# Patient Record
Sex: Female | Born: 1980 | Race: Black or African American | Hispanic: No | Smoking: Never smoker
Health system: Southern US, Community
[De-identification: ages and names within clinical notes are randomized; demographics above are authoritative.]

## PROBLEM LIST (undated history)

## (undated) DIAGNOSIS — D649 Anemia, unspecified: Secondary | ICD-10-CM

## (undated) DIAGNOSIS — N883 Incompetence of cervix uteri: Secondary | ICD-10-CM

## (undated) DIAGNOSIS — E559 Vitamin D deficiency, unspecified: Secondary | ICD-10-CM

## (undated) DIAGNOSIS — I10 Essential (primary) hypertension: Secondary | ICD-10-CM

## (undated) HISTORY — DX: Vitamin D deficiency, unspecified: E55.9

## (undated) HISTORY — DX: Anemia, unspecified: D64.9

## (undated) HISTORY — PX: TUBAL LIGATION: SHX77

## (undated) HISTORY — PX: OTHER SURGICAL HISTORY: SHX169

## (undated) HISTORY — DX: Incompetence of cervix uteri: N88.3

## (undated) HISTORY — DX: Essential (primary) hypertension: I10

---

## 2003-07-03 ENCOUNTER — Emergency Department (HOSPITAL_COMMUNITY): Admission: EM | Admit: 2003-07-03 | Discharge: 2003-07-03 | Payer: Self-pay | Admitting: Emergency Medicine

## 2004-04-12 ENCOUNTER — Inpatient Hospital Stay (HOSPITAL_COMMUNITY): Admission: AD | Admit: 2004-04-12 | Discharge: 2004-04-12 | Payer: Self-pay | Admitting: Obstetrics & Gynecology

## 2004-07-08 ENCOUNTER — Inpatient Hospital Stay (HOSPITAL_COMMUNITY): Admission: AD | Admit: 2004-07-08 | Discharge: 2004-07-08 | Payer: Self-pay | Admitting: Obstetrics & Gynecology

## 2004-08-14 ENCOUNTER — Inpatient Hospital Stay (HOSPITAL_COMMUNITY): Admission: AD | Admit: 2004-08-14 | Discharge: 2004-08-17 | Payer: Self-pay | Admitting: Obstetrics & Gynecology

## 2004-08-14 ENCOUNTER — Ambulatory Visit: Payer: Self-pay | Admitting: Neonatology

## 2004-08-14 ENCOUNTER — Ambulatory Visit: Payer: Self-pay | Admitting: Obstetrics and Gynecology

## 2004-08-25 ENCOUNTER — Encounter: Admission: RE | Admit: 2004-08-25 | Discharge: 2004-09-24 | Payer: Self-pay | Admitting: Obstetrics & Gynecology

## 2004-10-02 ENCOUNTER — Ambulatory Visit: Payer: Self-pay | Admitting: Family Medicine

## 2004-12-11 ENCOUNTER — Ambulatory Visit: Payer: Self-pay | Admitting: Obstetrics and Gynecology

## 2005-01-08 ENCOUNTER — Ambulatory Visit: Payer: Self-pay | Admitting: Obstetrics & Gynecology

## 2005-01-28 ENCOUNTER — Encounter (INDEPENDENT_AMBULATORY_CARE_PROVIDER_SITE_OTHER): Payer: Self-pay | Admitting: Specialist

## 2005-01-28 ENCOUNTER — Inpatient Hospital Stay (HOSPITAL_COMMUNITY): Admission: AD | Admit: 2005-01-28 | Discharge: 2005-01-28 | Payer: Self-pay | Admitting: Obstetrics and Gynecology

## 2005-02-12 ENCOUNTER — Ambulatory Visit: Payer: Self-pay | Admitting: Obstetrics and Gynecology

## 2006-02-02 ENCOUNTER — Inpatient Hospital Stay (HOSPITAL_COMMUNITY): Admission: AD | Admit: 2006-02-02 | Discharge: 2006-02-02 | Payer: Self-pay | Admitting: Family Medicine

## 2006-02-26 ENCOUNTER — Ambulatory Visit: Payer: Self-pay | Admitting: Obstetrics & Gynecology

## 2006-02-27 ENCOUNTER — Ambulatory Visit (HOSPITAL_COMMUNITY): Admission: RE | Admit: 2006-02-27 | Discharge: 2006-02-27 | Payer: Self-pay | Admitting: Family Medicine

## 2006-03-13 ENCOUNTER — Ambulatory Visit: Payer: Self-pay | Admitting: Gynecology

## 2006-03-25 ENCOUNTER — Ambulatory Visit: Payer: Self-pay | Admitting: Obstetrics and Gynecology

## 2006-03-25 ENCOUNTER — Inpatient Hospital Stay (HOSPITAL_COMMUNITY): Admission: RE | Admit: 2006-03-25 | Discharge: 2006-03-25 | Payer: Self-pay | Admitting: Obstetrics and Gynecology

## 2006-03-25 ENCOUNTER — Encounter: Payer: Self-pay | Admitting: Vascular Surgery

## 2006-04-04 ENCOUNTER — Ambulatory Visit: Payer: Self-pay | Admitting: *Deleted

## 2006-04-04 ENCOUNTER — Inpatient Hospital Stay (HOSPITAL_COMMUNITY): Admission: AD | Admit: 2006-04-04 | Discharge: 2006-04-04 | Payer: Self-pay | Admitting: Obstetrics & Gynecology

## 2006-04-10 ENCOUNTER — Ambulatory Visit: Payer: Self-pay | Admitting: Gynecology

## 2006-04-14 ENCOUNTER — Ambulatory Visit: Payer: Self-pay | Admitting: Gynecology

## 2006-04-24 ENCOUNTER — Ambulatory Visit: Payer: Self-pay | Admitting: Family Medicine

## 2006-04-26 ENCOUNTER — Ambulatory Visit: Payer: Self-pay | Admitting: *Deleted

## 2006-04-26 ENCOUNTER — Inpatient Hospital Stay (HOSPITAL_COMMUNITY): Admission: AD | Admit: 2006-04-26 | Discharge: 2006-04-26 | Payer: Self-pay | Admitting: Obstetrics and Gynecology

## 2006-04-30 ENCOUNTER — Inpatient Hospital Stay (HOSPITAL_COMMUNITY): Admission: AD | Admit: 2006-04-30 | Discharge: 2006-05-01 | Payer: Self-pay | Admitting: Obstetrics and Gynecology

## 2006-04-30 ENCOUNTER — Encounter (INDEPENDENT_AMBULATORY_CARE_PROVIDER_SITE_OTHER): Payer: Self-pay | Admitting: Specialist

## 2006-04-30 ENCOUNTER — Ambulatory Visit: Payer: Self-pay | Admitting: Obstetrics and Gynecology

## 2007-08-17 ENCOUNTER — Inpatient Hospital Stay (HOSPITAL_COMMUNITY): Admission: AD | Admit: 2007-08-17 | Discharge: 2007-08-17 | Payer: Self-pay | Admitting: Obstetrics and Gynecology

## 2007-08-26 ENCOUNTER — Encounter: Payer: Self-pay | Admitting: Obstetrics & Gynecology

## 2007-08-26 ENCOUNTER — Ambulatory Visit: Payer: Self-pay | Admitting: Obstetrics & Gynecology

## 2007-09-03 ENCOUNTER — Inpatient Hospital Stay (HOSPITAL_COMMUNITY): Admission: AD | Admit: 2007-09-03 | Discharge: 2007-09-03 | Payer: Self-pay | Admitting: Obstetrics and Gynecology

## 2007-09-08 ENCOUNTER — Ambulatory Visit (HOSPITAL_COMMUNITY): Admission: RE | Admit: 2007-09-08 | Discharge: 2007-09-08 | Payer: Self-pay | Admitting: Obstetrics & Gynecology

## 2007-09-10 ENCOUNTER — Ambulatory Visit: Payer: Self-pay | Admitting: Obstetrics & Gynecology

## 2007-09-30 ENCOUNTER — Inpatient Hospital Stay (HOSPITAL_COMMUNITY): Admission: AD | Admit: 2007-09-30 | Discharge: 2007-09-30 | Payer: Self-pay | Admitting: Obstetrics & Gynecology

## 2007-10-02 ENCOUNTER — Ambulatory Visit (HOSPITAL_COMMUNITY): Admission: RE | Admit: 2007-10-02 | Discharge: 2007-10-02 | Payer: Self-pay | Admitting: Obstetrics & Gynecology

## 2007-10-05 ENCOUNTER — Ambulatory Visit: Payer: Self-pay | Admitting: Obstetrics & Gynecology

## 2007-10-12 ENCOUNTER — Ambulatory Visit: Payer: Self-pay | Admitting: Obstetrics & Gynecology

## 2007-10-13 ENCOUNTER — Ambulatory Visit (HOSPITAL_COMMUNITY): Admission: RE | Admit: 2007-10-13 | Discharge: 2007-10-13 | Payer: Self-pay | Admitting: Obstetrics & Gynecology

## 2007-10-26 ENCOUNTER — Ambulatory Visit: Payer: Self-pay | Admitting: Obstetrics & Gynecology

## 2007-10-27 ENCOUNTER — Ambulatory Visit (HOSPITAL_COMMUNITY): Admission: RE | Admit: 2007-10-27 | Discharge: 2007-10-27 | Payer: Self-pay | Admitting: Obstetrics & Gynecology

## 2007-11-02 ENCOUNTER — Ambulatory Visit: Payer: Self-pay | Admitting: Obstetrics & Gynecology

## 2007-11-09 ENCOUNTER — Ambulatory Visit: Payer: Self-pay | Admitting: Obstetrics & Gynecology

## 2007-11-11 ENCOUNTER — Ambulatory Visit (HOSPITAL_COMMUNITY): Admission: RE | Admit: 2007-11-11 | Discharge: 2007-11-11 | Payer: Self-pay | Admitting: Obstetrics & Gynecology

## 2007-11-23 ENCOUNTER — Ambulatory Visit: Payer: Self-pay | Admitting: Obstetrics & Gynecology

## 2007-11-25 ENCOUNTER — Ambulatory Visit (HOSPITAL_COMMUNITY): Admission: RE | Admit: 2007-11-25 | Discharge: 2007-11-25 | Payer: Self-pay

## 2007-12-02 ENCOUNTER — Ambulatory Visit (HOSPITAL_COMMUNITY): Admission: RE | Admit: 2007-12-02 | Discharge: 2007-12-02 | Payer: Self-pay | Admitting: Obstetrics & Gynecology

## 2007-12-07 ENCOUNTER — Ambulatory Visit: Payer: Self-pay | Admitting: Family Medicine

## 2007-12-09 ENCOUNTER — Ambulatory Visit (HOSPITAL_COMMUNITY): Admission: RE | Admit: 2007-12-09 | Discharge: 2007-12-09 | Payer: Self-pay | Admitting: Obstetrics & Gynecology

## 2007-12-21 ENCOUNTER — Ambulatory Visit: Payer: Self-pay | Admitting: *Deleted

## 2007-12-21 ENCOUNTER — Inpatient Hospital Stay (HOSPITAL_COMMUNITY): Admission: AD | Admit: 2007-12-21 | Discharge: 2007-12-21 | Payer: Self-pay | Admitting: Obstetrics and Gynecology

## 2007-12-21 ENCOUNTER — Ambulatory Visit: Payer: Self-pay | Admitting: Family Medicine

## 2007-12-23 ENCOUNTER — Ambulatory Visit: Payer: Self-pay | Admitting: Obstetrics and Gynecology

## 2007-12-23 ENCOUNTER — Encounter: Payer: Self-pay | Admitting: *Deleted

## 2007-12-23 ENCOUNTER — Inpatient Hospital Stay (HOSPITAL_COMMUNITY): Admission: AD | Admit: 2007-12-23 | Discharge: 2007-12-23 | Payer: Self-pay | Admitting: Obstetrics and Gynecology

## 2007-12-24 ENCOUNTER — Inpatient Hospital Stay (HOSPITAL_COMMUNITY): Admission: AD | Admit: 2007-12-24 | Discharge: 2007-12-24 | Payer: Self-pay | Admitting: Obstetrics & Gynecology

## 2007-12-28 ENCOUNTER — Ambulatory Visit: Payer: Self-pay | Admitting: Family Medicine

## 2007-12-30 ENCOUNTER — Ambulatory Visit (HOSPITAL_COMMUNITY): Admission: RE | Admit: 2007-12-30 | Discharge: 2007-12-30 | Payer: Self-pay | Admitting: Obstetrics & Gynecology

## 2008-01-04 ENCOUNTER — Ambulatory Visit: Payer: Self-pay | Admitting: Obstetrics & Gynecology

## 2008-01-06 ENCOUNTER — Ambulatory Visit (HOSPITAL_COMMUNITY): Admission: RE | Admit: 2008-01-06 | Discharge: 2008-01-06 | Payer: Self-pay | Admitting: Family Medicine

## 2008-01-11 ENCOUNTER — Ambulatory Visit: Payer: Self-pay | Admitting: *Deleted

## 2008-01-13 ENCOUNTER — Ambulatory Visit (HOSPITAL_COMMUNITY): Admission: RE | Admit: 2008-01-13 | Discharge: 2008-01-13 | Payer: Self-pay | Admitting: Family Medicine

## 2008-01-18 ENCOUNTER — Ambulatory Visit: Payer: Self-pay | Admitting: Obstetrics & Gynecology

## 2008-01-20 ENCOUNTER — Ambulatory Visit (HOSPITAL_COMMUNITY): Admission: RE | Admit: 2008-01-20 | Discharge: 2008-01-20 | Payer: Self-pay | Admitting: Obstetrics & Gynecology

## 2008-01-25 ENCOUNTER — Ambulatory Visit: Payer: Self-pay | Admitting: Obstetrics & Gynecology

## 2008-01-27 ENCOUNTER — Ambulatory Visit (HOSPITAL_COMMUNITY): Admission: RE | Admit: 2008-01-27 | Discharge: 2008-01-27 | Payer: Self-pay | Admitting: Obstetrics & Gynecology

## 2008-02-01 ENCOUNTER — Ambulatory Visit: Payer: Self-pay | Admitting: Obstetrics & Gynecology

## 2008-02-04 ENCOUNTER — Ambulatory Visit: Payer: Self-pay | Admitting: Obstetrics & Gynecology

## 2008-02-08 ENCOUNTER — Ambulatory Visit: Payer: Self-pay | Admitting: Obstetrics & Gynecology

## 2008-02-15 ENCOUNTER — Ambulatory Visit: Payer: Self-pay | Admitting: Obstetrics & Gynecology

## 2008-02-29 ENCOUNTER — Ambulatory Visit: Payer: Self-pay | Admitting: Obstetrics & Gynecology

## 2008-02-29 LAB — CONVERTED CEMR LAB: Pap Smear: NORMAL

## 2008-03-01 ENCOUNTER — Inpatient Hospital Stay (HOSPITAL_COMMUNITY): Admission: AD | Admit: 2008-03-01 | Discharge: 2008-03-03 | Payer: Self-pay | Admitting: Obstetrics & Gynecology

## 2008-03-01 ENCOUNTER — Ambulatory Visit: Payer: Self-pay | Admitting: *Deleted

## 2008-03-02 ENCOUNTER — Encounter (INDEPENDENT_AMBULATORY_CARE_PROVIDER_SITE_OTHER): Payer: Self-pay | Admitting: Gynecology

## 2008-04-13 ENCOUNTER — Encounter: Payer: Self-pay | Admitting: Obstetrics and Gynecology

## 2008-04-13 ENCOUNTER — Encounter (INDEPENDENT_AMBULATORY_CARE_PROVIDER_SITE_OTHER): Payer: Self-pay | Admitting: Nurse Practitioner

## 2008-04-13 ENCOUNTER — Ambulatory Visit: Payer: Self-pay | Admitting: Obstetrics and Gynecology

## 2008-04-13 LAB — CONVERTED CEMR LAB

## 2008-07-07 ENCOUNTER — Ambulatory Visit: Payer: Self-pay | Admitting: Nurse Practitioner

## 2008-07-07 DIAGNOSIS — R030 Elevated blood-pressure reading, without diagnosis of hypertension: Secondary | ICD-10-CM | POA: Insufficient documentation

## 2008-07-07 DIAGNOSIS — E669 Obesity, unspecified: Secondary | ICD-10-CM | POA: Insufficient documentation

## 2008-07-08 DIAGNOSIS — D649 Anemia, unspecified: Secondary | ICD-10-CM | POA: Insufficient documentation

## 2008-07-08 LAB — CONVERTED CEMR LAB
ALT: 10 units/L (ref 0–35)
Albumin: 4.3 g/dL (ref 3.5–5.2)
Basophils Absolute: 0 10*3/uL (ref 0.0–0.1)
CO2: 25 meq/L (ref 19–32)
Calcium: 9.8 mg/dL (ref 8.4–10.5)
Chloride: 103 meq/L (ref 96–112)
Glucose, Bld: 89 mg/dL (ref 70–99)
HCT: 35.5 % — ABNORMAL LOW (ref 36.0–46.0)
Lymphocytes Relative: 47 % — ABNORMAL HIGH (ref 12–46)
Lymphs Abs: 1.5 10*3/uL (ref 0.7–4.0)
Neutro Abs: 1.2 10*3/uL — ABNORMAL LOW (ref 1.7–7.7)
Platelets: 244 10*3/uL (ref 150–400)
Potassium: 4.4 meq/L (ref 3.5–5.3)
RDW: 16.1 % — ABNORMAL HIGH (ref 11.5–15.5)
Sodium: 139 meq/L (ref 135–145)
TSH: 1.207 microintl units/mL (ref 0.350–4.50)
Total Protein: 7 g/dL (ref 6.0–8.3)
WBC: 3.2 10*3/uL — ABNORMAL LOW (ref 4.0–10.5)

## 2008-07-12 ENCOUNTER — Encounter (INDEPENDENT_AMBULATORY_CARE_PROVIDER_SITE_OTHER): Payer: Self-pay | Admitting: Nurse Practitioner

## 2008-07-19 ENCOUNTER — Ambulatory Visit: Payer: Self-pay | Admitting: Nurse Practitioner

## 2008-07-24 IMAGING — US US OB FOLLOW-UP
1 series · 14 of 28 positions shown · non-contrast
Comparison: none

OBSTETRICAL ULTRASOUND:
 This ultrasound was performed in The [HOSPITAL], and the [facility] report will be stored to [REDACTED] PACS.

[Series 1: us ob follow-up · 14 of 37 slices shown]
[im 2/37]
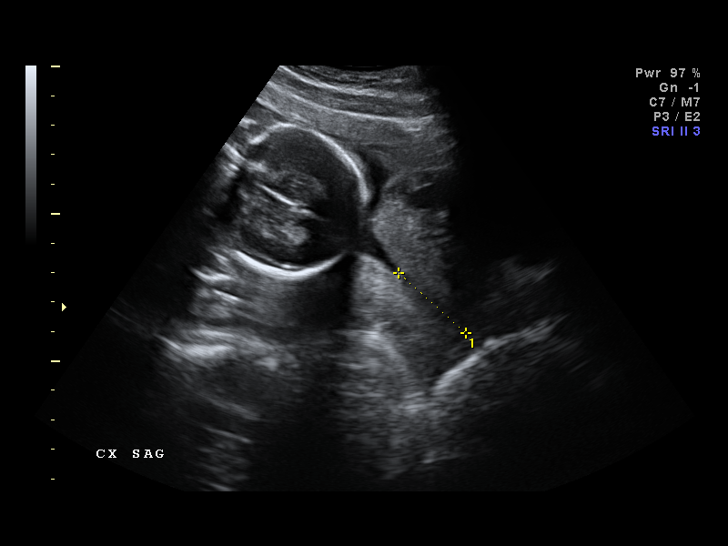
[im 5/37]
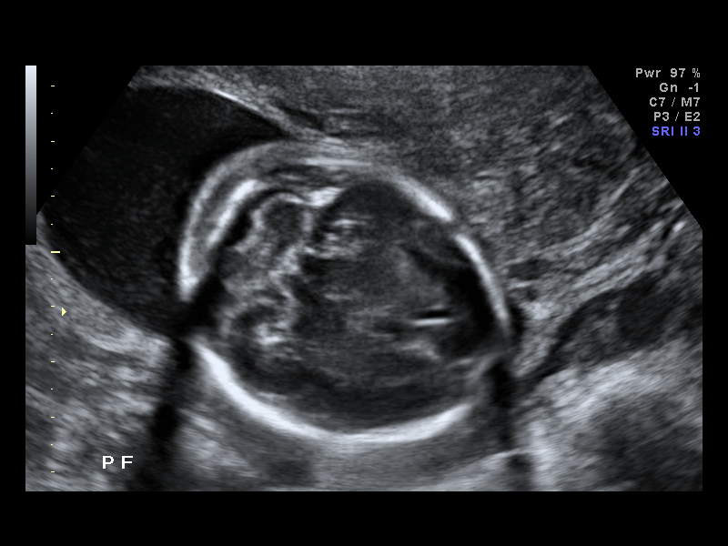
[im 7/37]
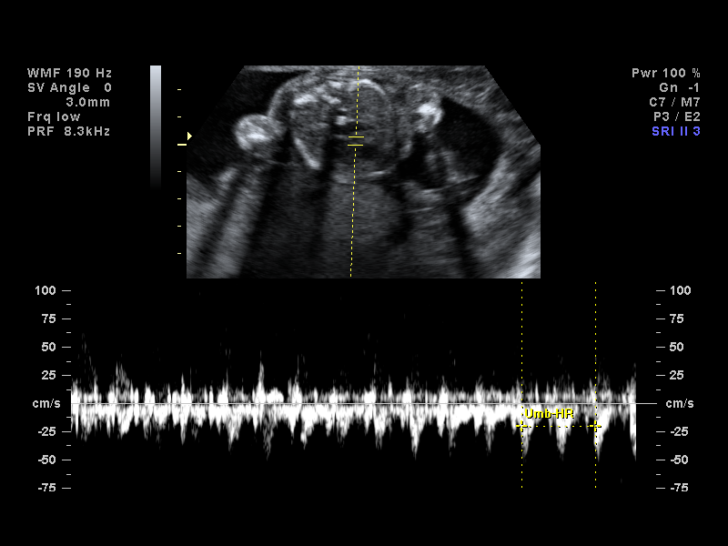
[im 10/37]
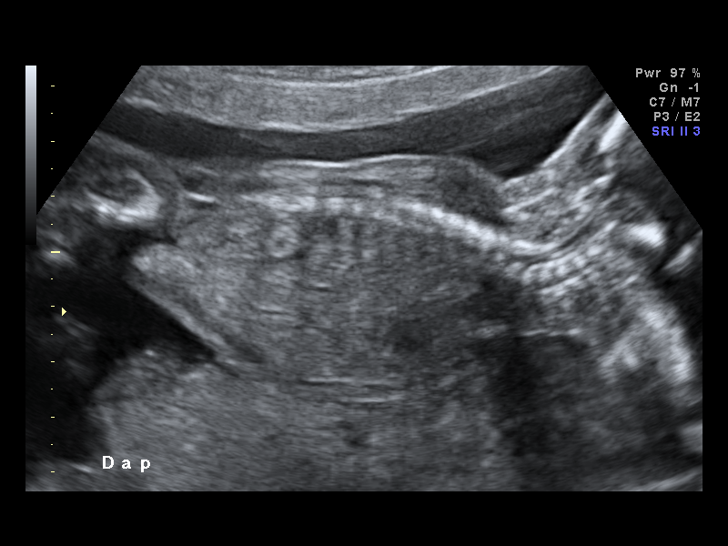
[im 13/37]
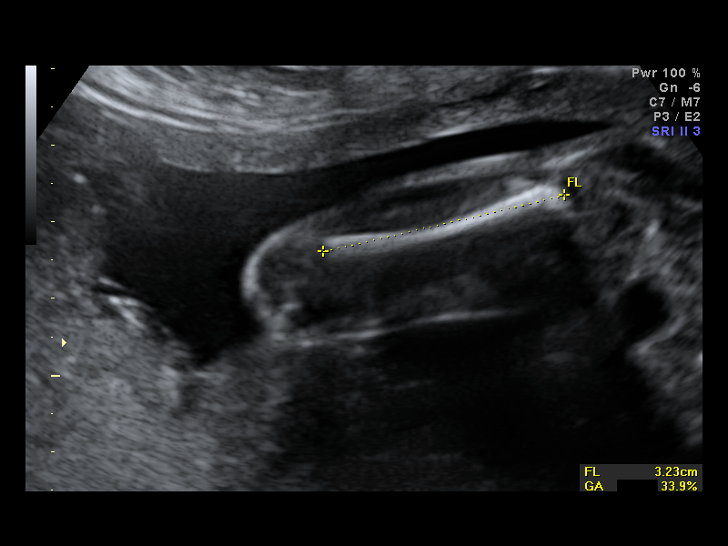
[im 15/37]
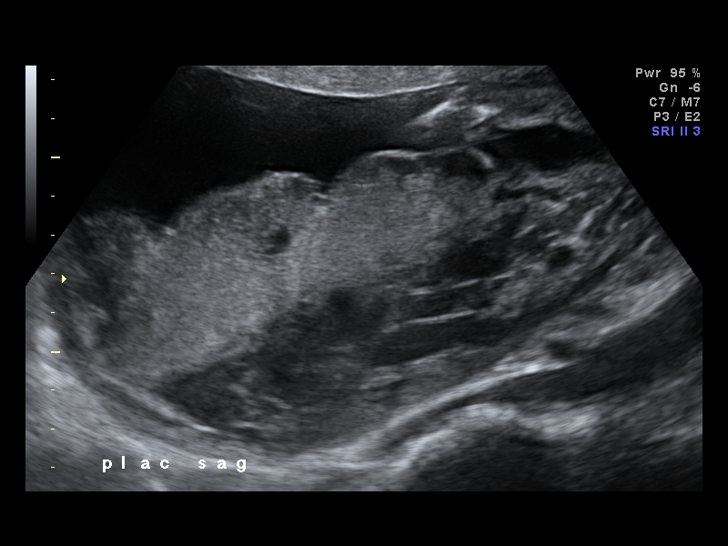
[im 18/37]
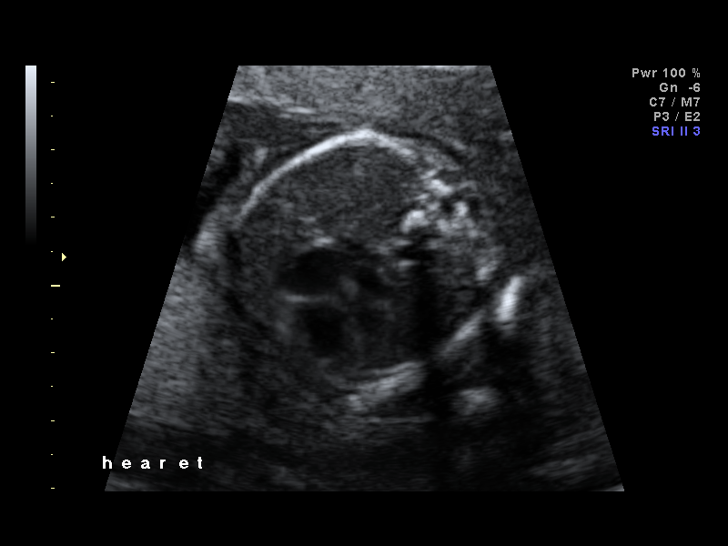
[im 21/37]
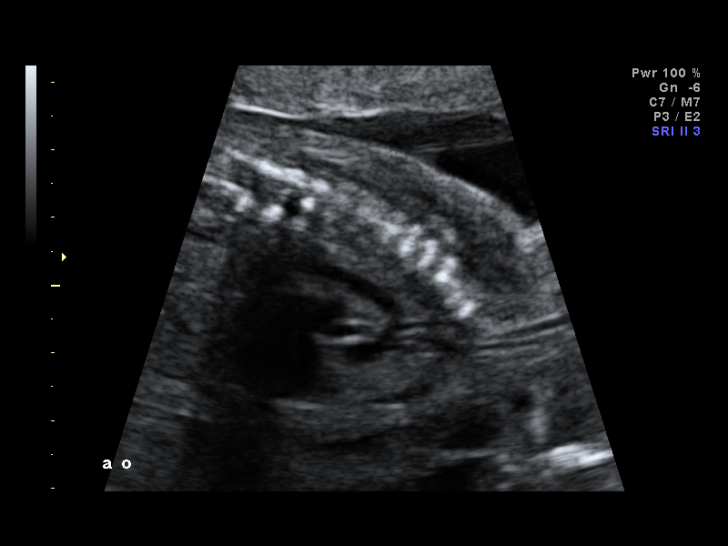
[im 23/37]
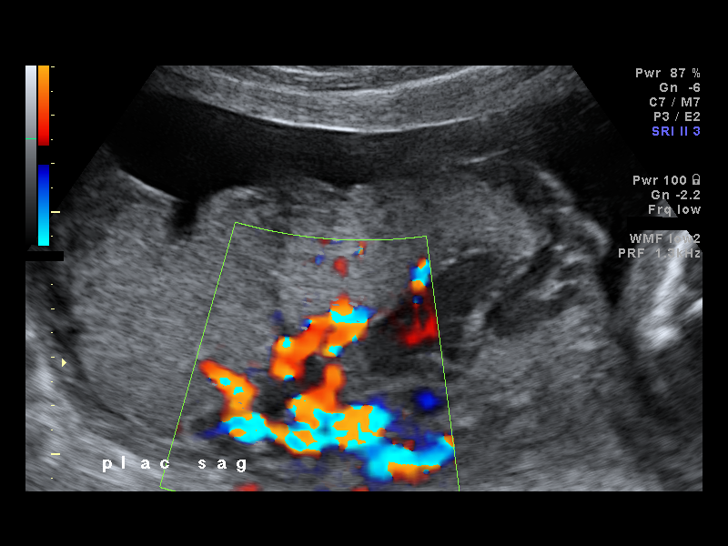
[im 26/37]
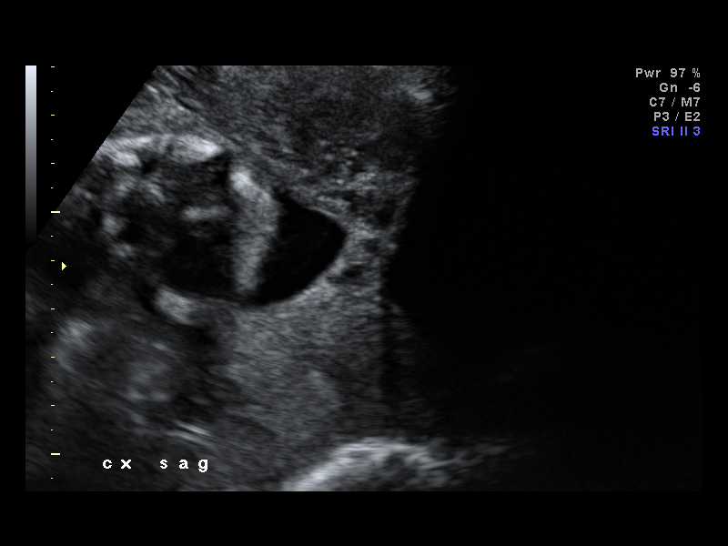
[im 29/37]
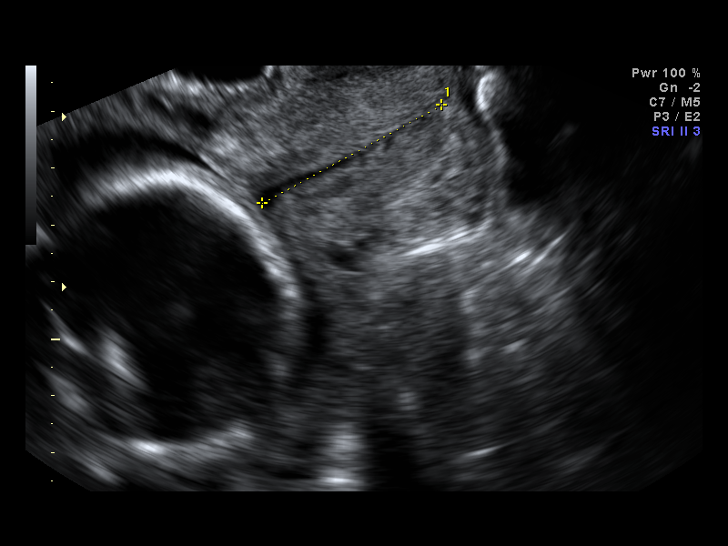
[im 31/37]
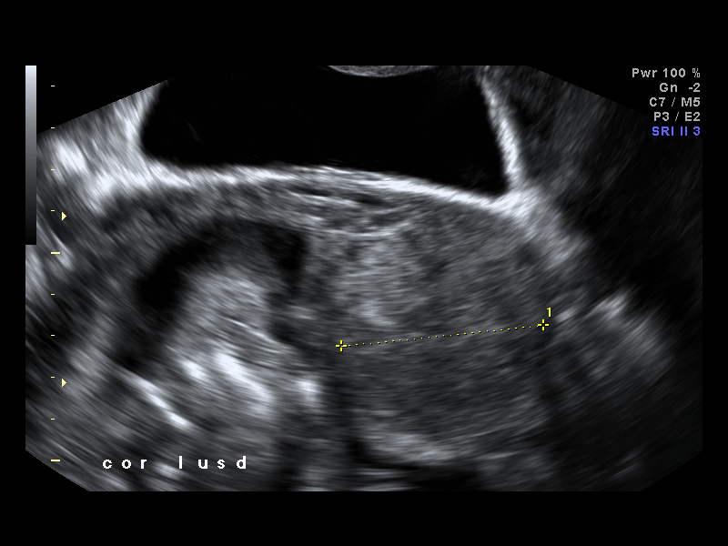
[im 34/37]
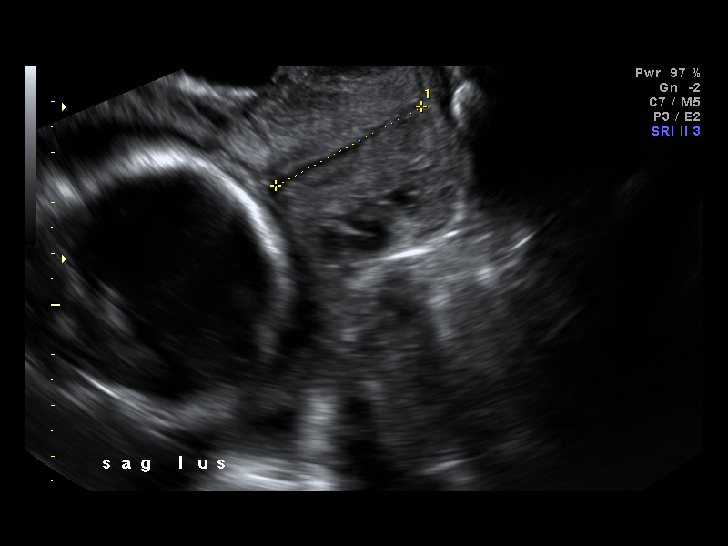
[im 37/37]
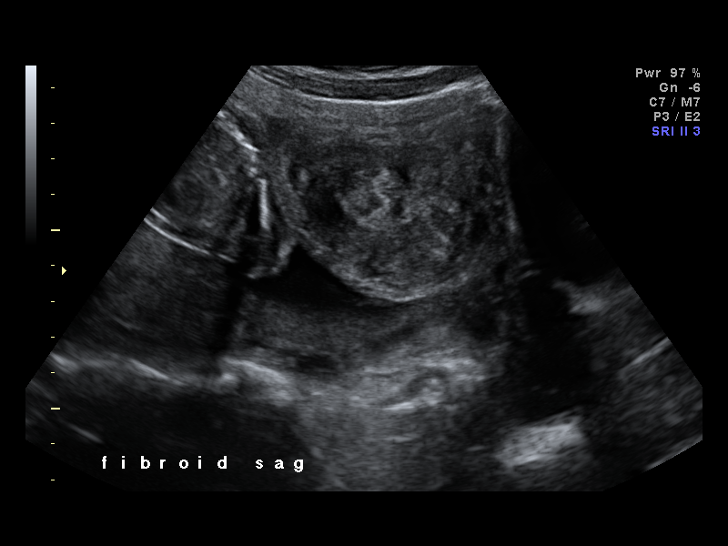

[14 of 28 positions shown; findings below may reference images not displayed]

IMPRESSION: The [facility] report has also been faxed to the ordering physician.

## 2008-07-25 ENCOUNTER — Encounter (INDEPENDENT_AMBULATORY_CARE_PROVIDER_SITE_OTHER): Payer: Self-pay | Admitting: Nurse Practitioner

## 2008-08-08 ENCOUNTER — Ambulatory Visit: Payer: Self-pay | Admitting: Nurse Practitioner

## 2008-08-11 ENCOUNTER — Encounter (INDEPENDENT_AMBULATORY_CARE_PROVIDER_SITE_OTHER): Payer: Self-pay | Admitting: Nurse Practitioner

## 2008-08-11 LAB — CONVERTED CEMR LAB
HCT: 35.9 % — ABNORMAL LOW (ref 36.0–46.0)
Platelets: 268 10*3/uL (ref 150–400)
RDW: 16.6 % — ABNORMAL HIGH (ref 11.5–15.5)
Retic Ct Pct: 0.7 % (ref 0.4–3.1)
WBC: 5 10*3/uL (ref 4.0–10.5)

## 2008-09-15 ENCOUNTER — Encounter (INDEPENDENT_AMBULATORY_CARE_PROVIDER_SITE_OTHER): Payer: Self-pay | Admitting: Nurse Practitioner

## 2008-09-19 IMAGING — US US OB FOLLOW-UP
1 series · 14 of 28 positions shown · non-contrast
Comparison: none

OBSTETRICAL ULTRASOUND:
 This ultrasound was performed in The [HOSPITAL], and the [facility] report will be stored to [REDACTED] PACS.

[Series 1: us ob follow-up · 14 of 46 slices shown]
[im 2/46]
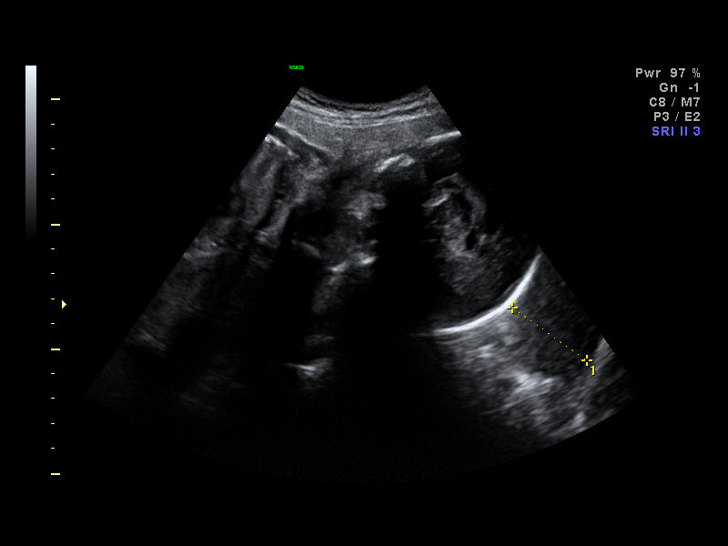
[im 6/46]
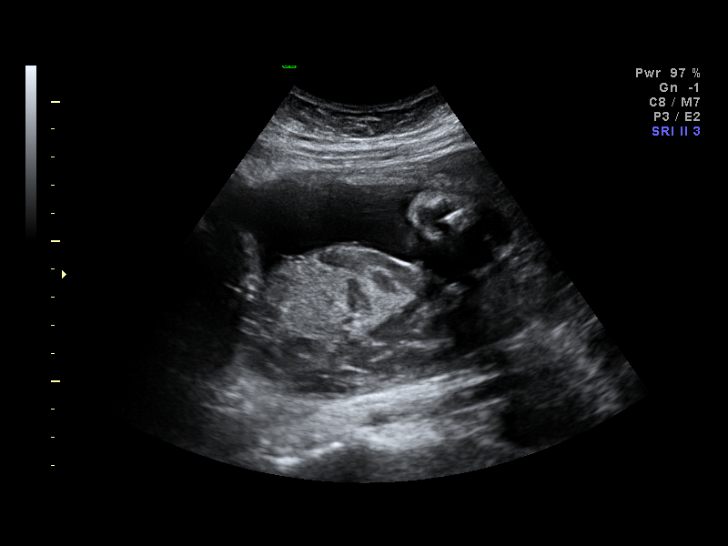
[im 9/46]
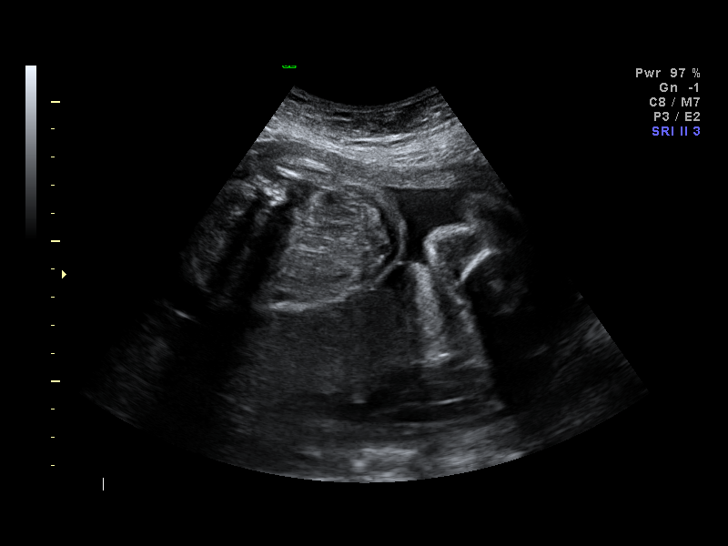
[im 12/46]
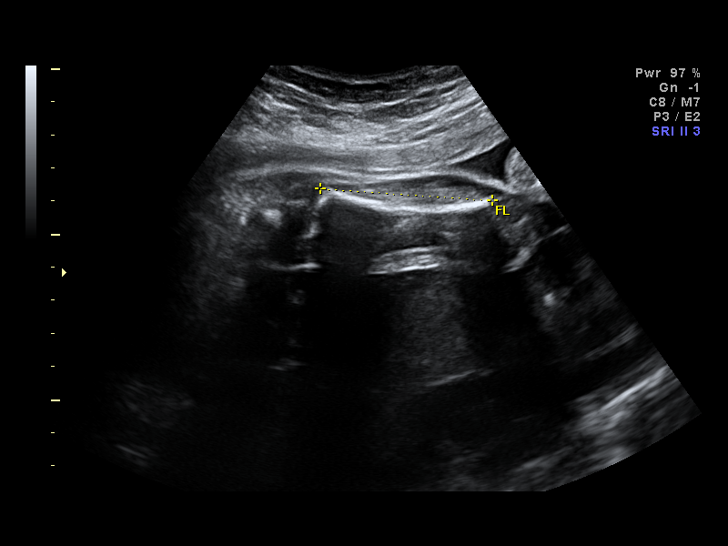
[im 16/46]
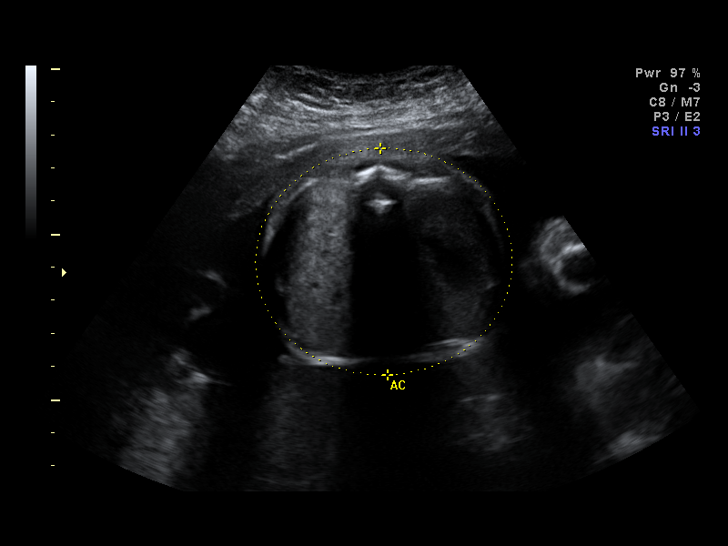
[im 19/46]
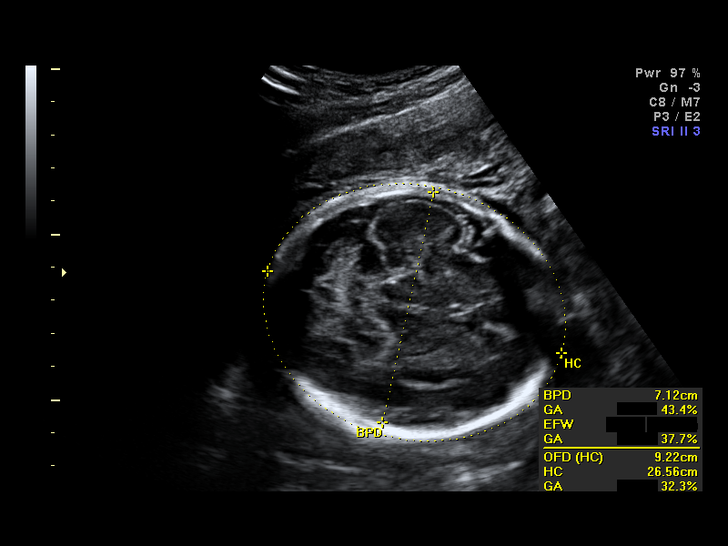
[im 22/46]
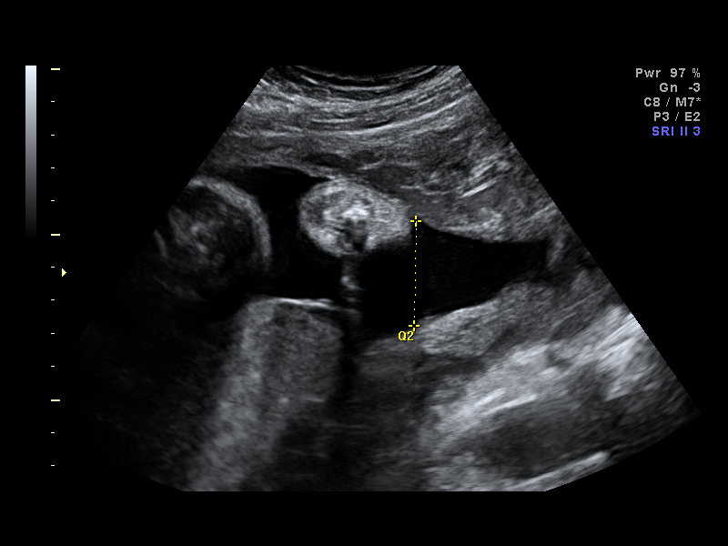
[im 26/46]
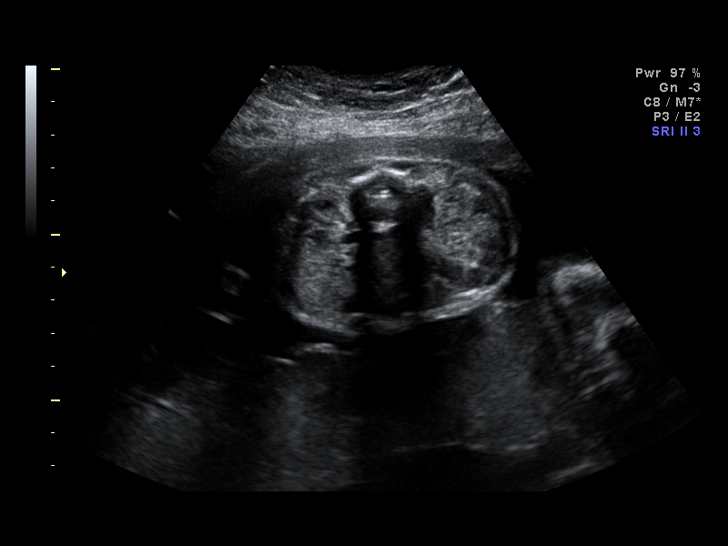
[im 29/46]
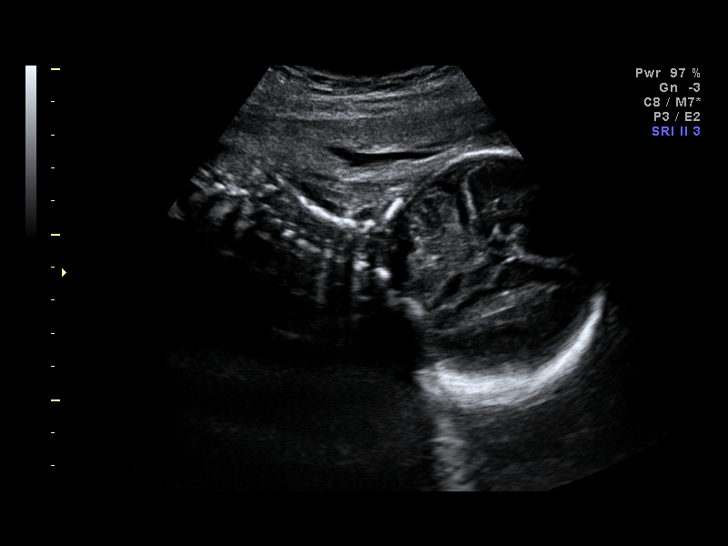
[im 32/46]
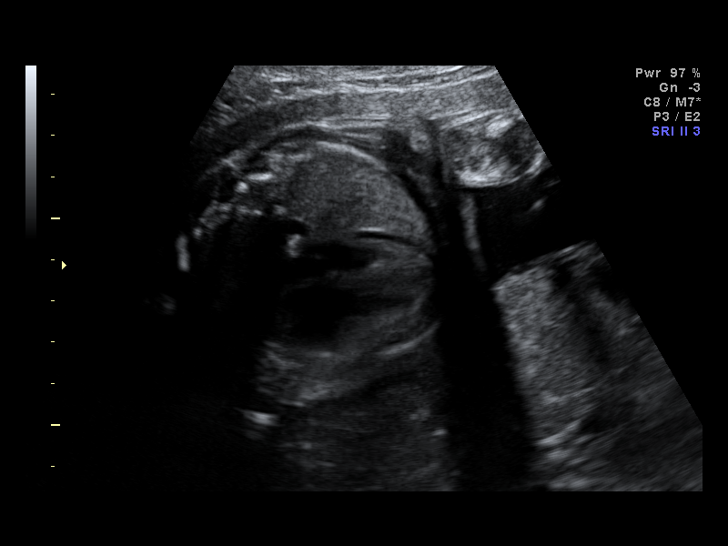
[im 36/46]
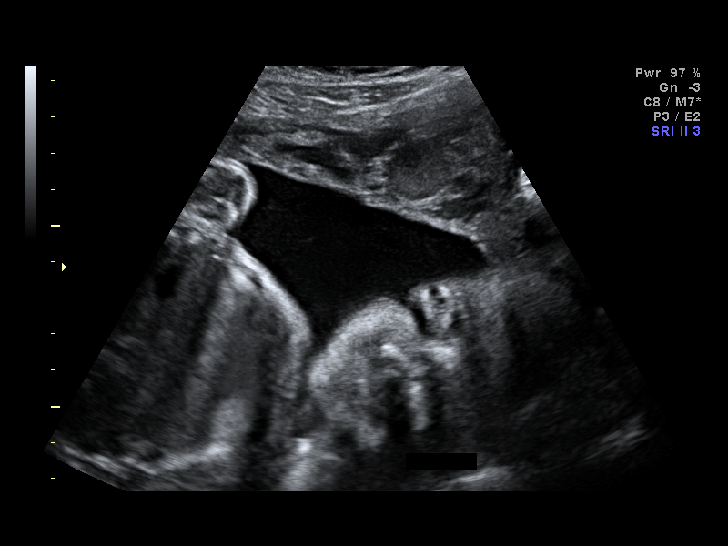
[im 39/46]
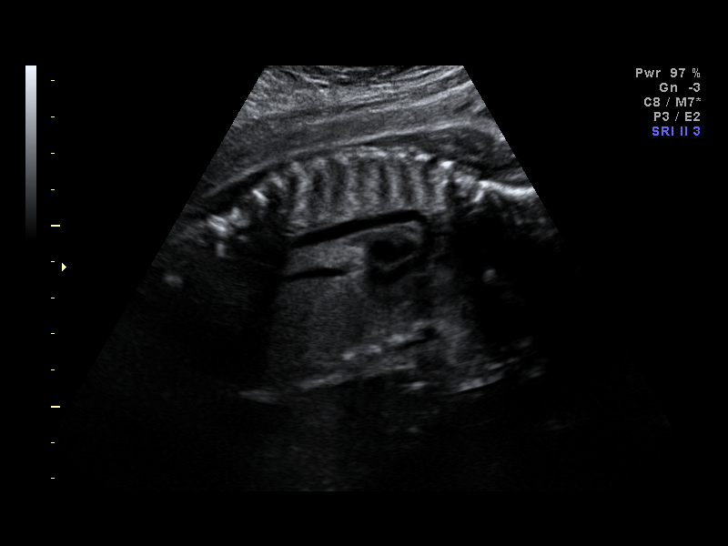
[im 42/46]
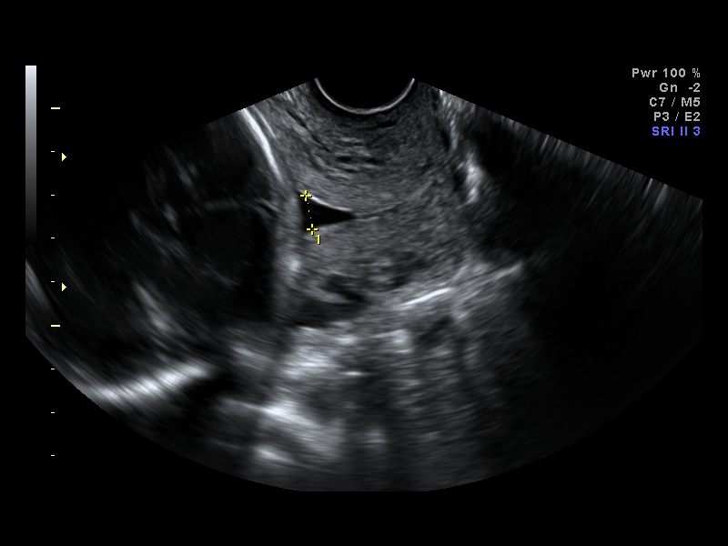
[im 46/46]
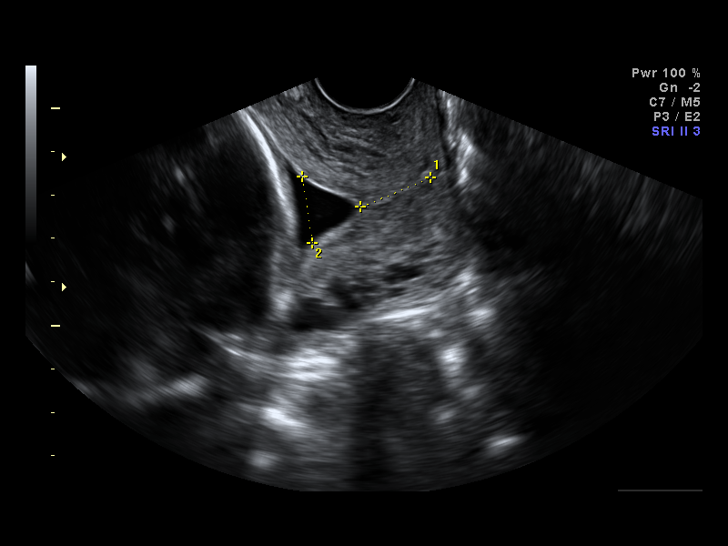

[14 of 28 positions shown; findings below may reference images not displayed]

IMPRESSION: [facility] has also been faxed to the ordering physician.

## 2008-09-26 IMAGING — US US OB TRANSVAGINAL
1 series · 14 of 19 positions shown · non-contrast
Comparison: none

OBSTETRICAL ULTRASOUND:
 This ultrasound was performed in The [HOSPITAL], and the [facility] report will be stored to [REDACTED] PACS.

[Series 1: us ob transvaginal · 14 of 19 slices shown]
[im 1/19]
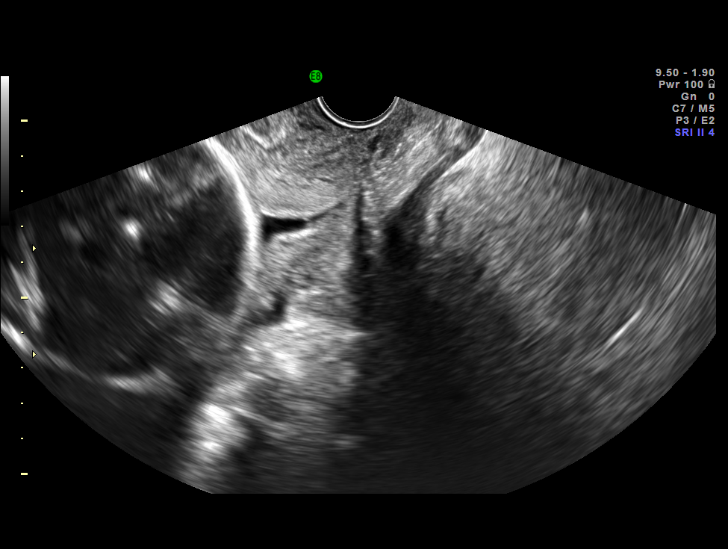
[im 3/19]
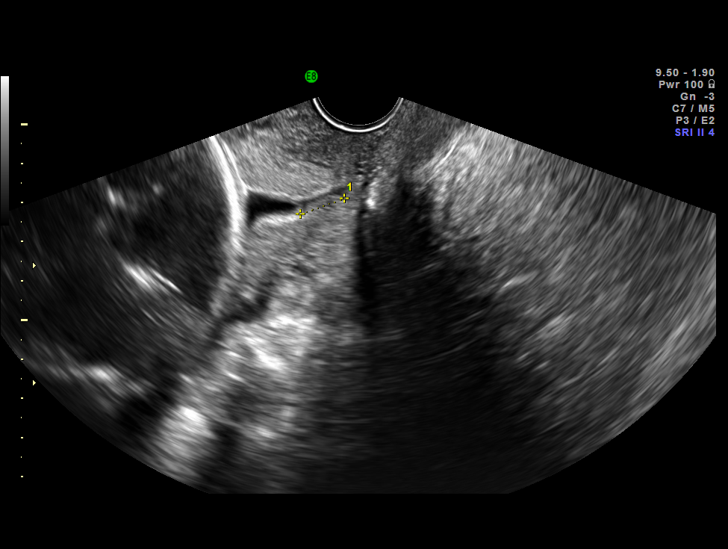
[im 4/19]
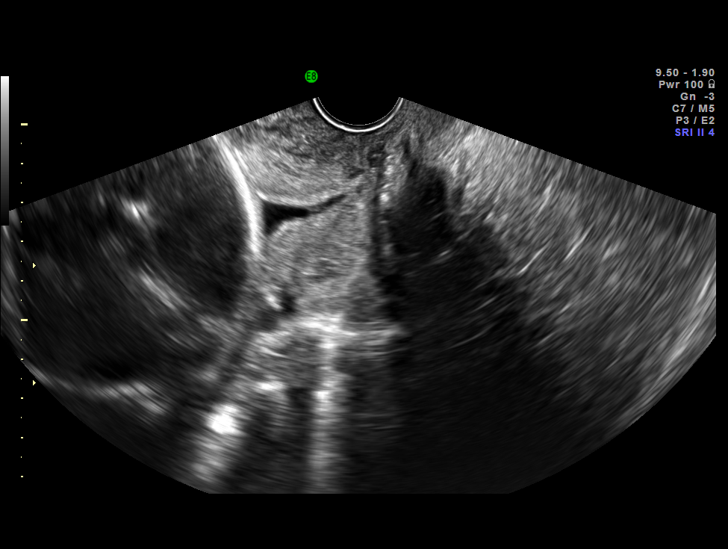
[im 5/19]
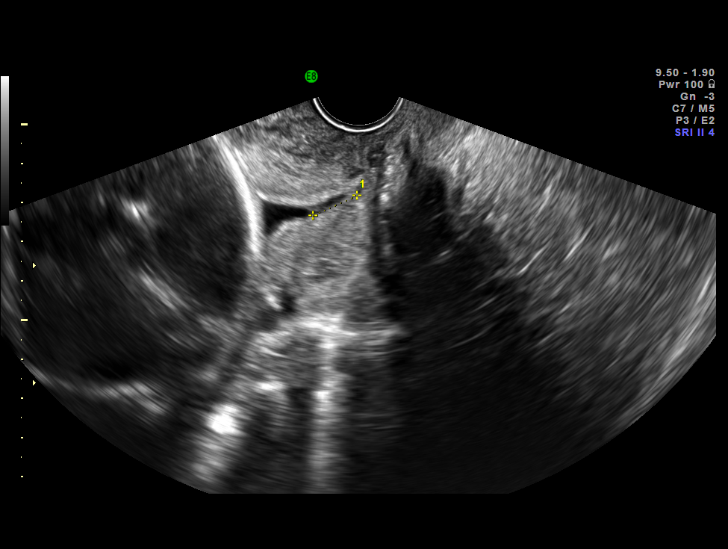
[im 7/19]
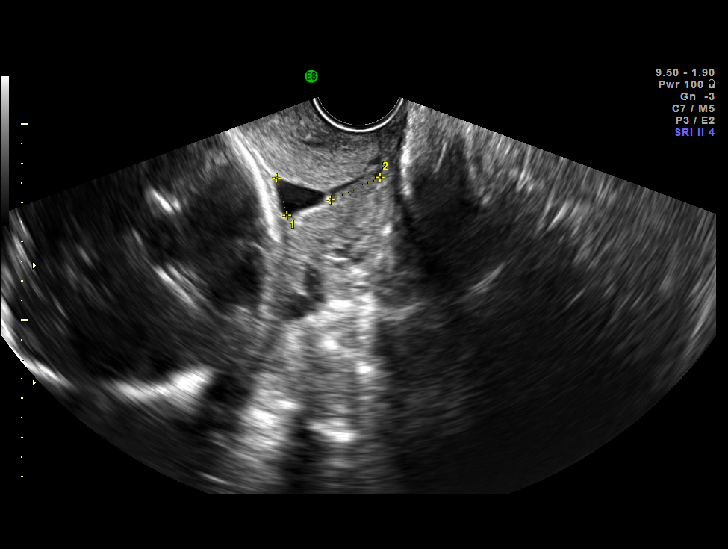
[im 8/19]
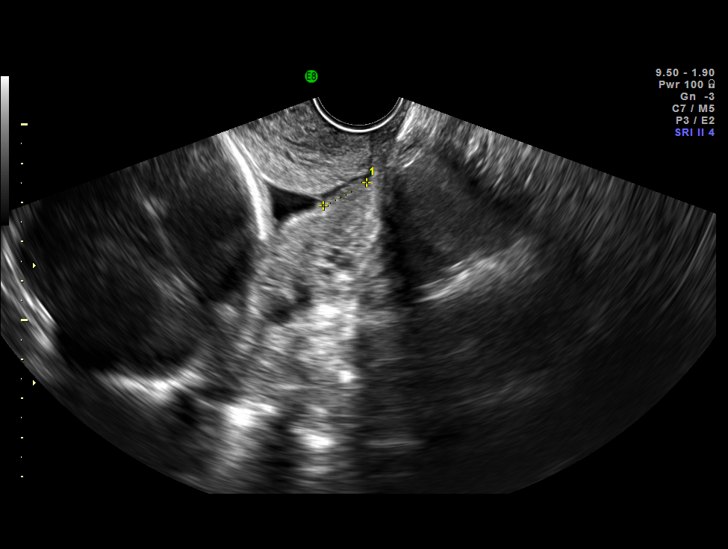
[im 9/19]
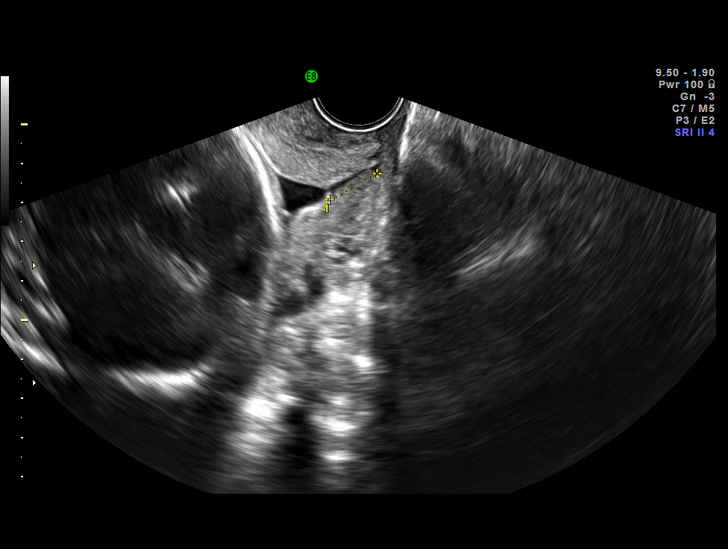
[im 11/19]
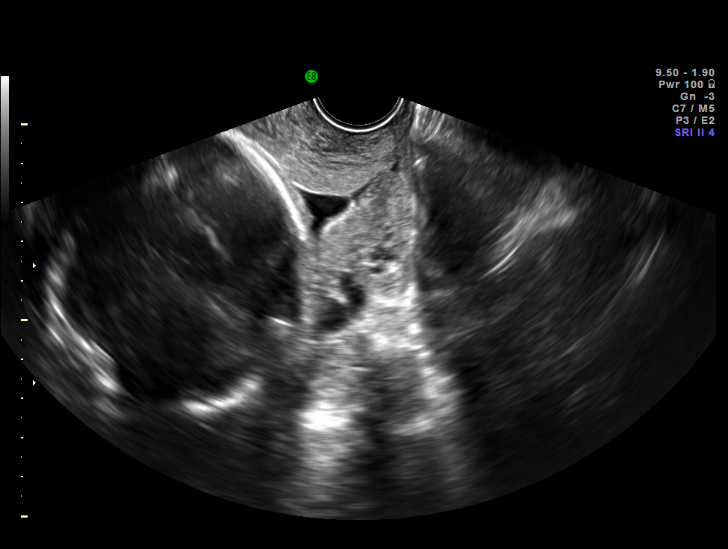
[im 12/19]
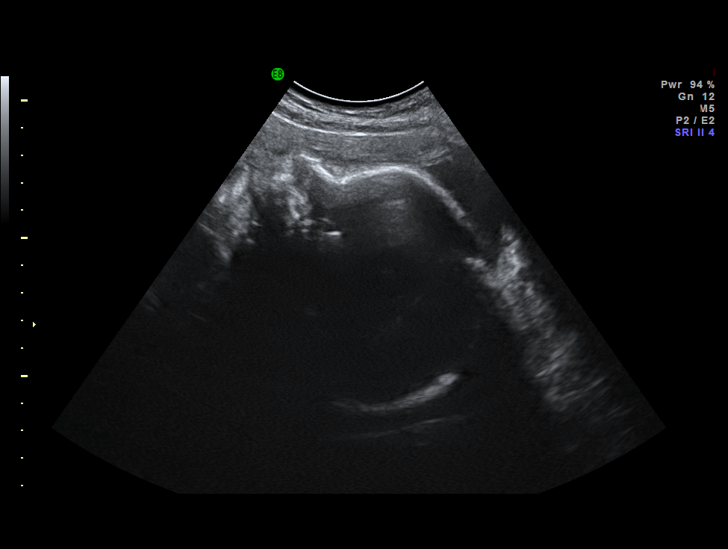
[im 13/19]
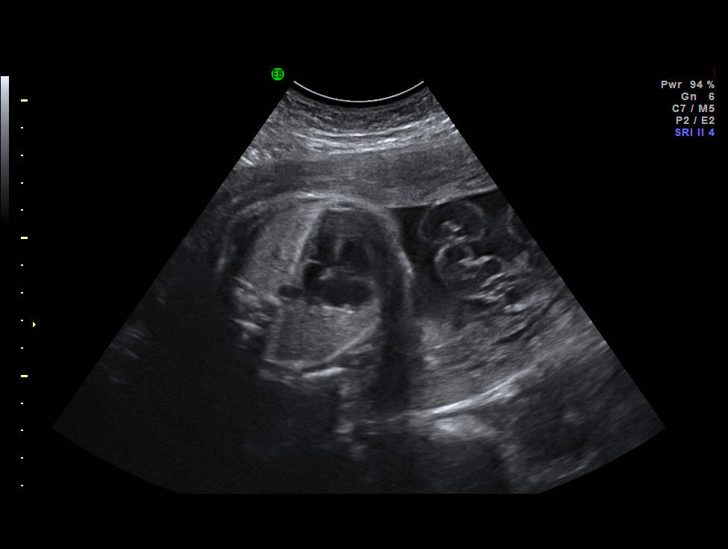
[im 15/19]
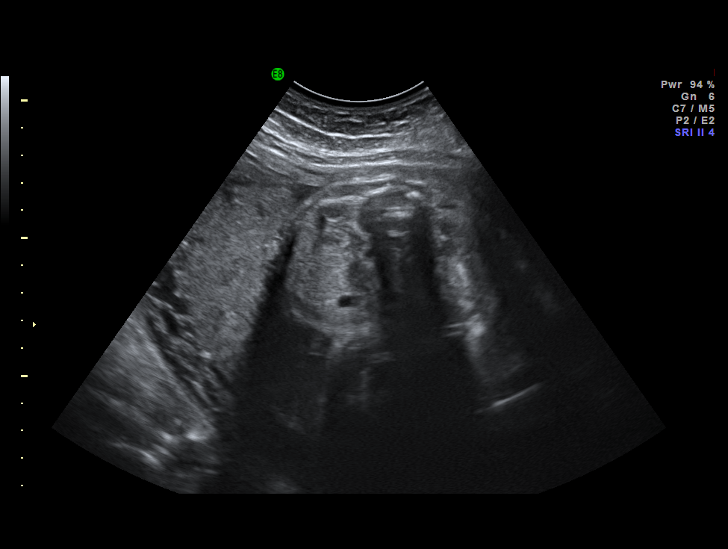
[im 16/19]
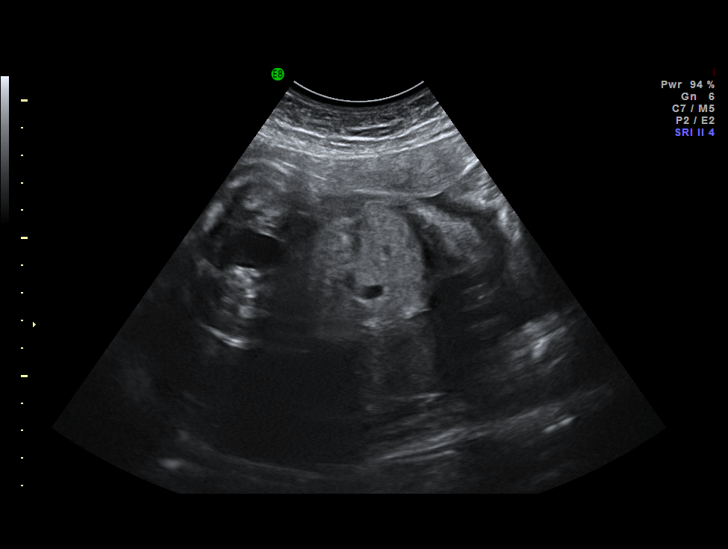
[im 17/19]
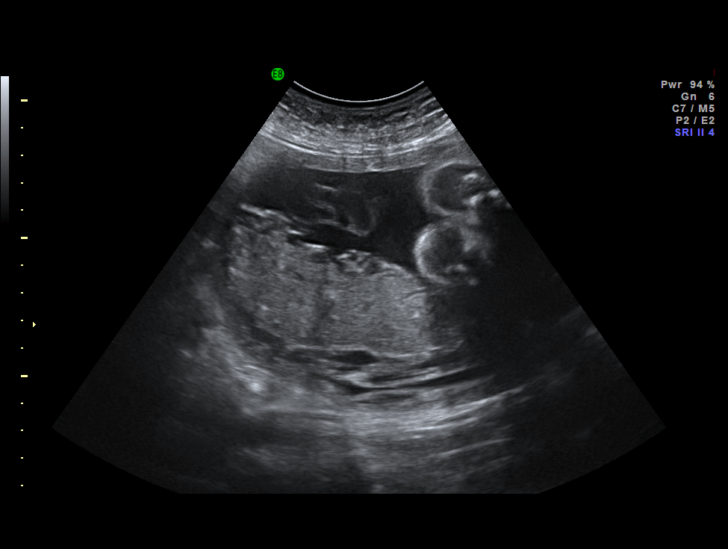
[im 19/19]
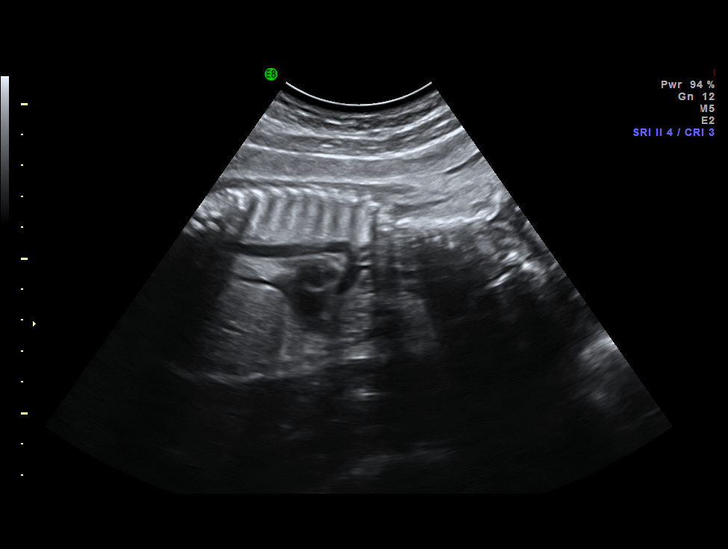

[14 of 19 positions shown; findings below may reference images not displayed]

IMPRESSION: [facility] has also been faxed to the ordering physician.

## 2008-10-18 ENCOUNTER — Encounter (INDEPENDENT_AMBULATORY_CARE_PROVIDER_SITE_OTHER): Payer: Self-pay | Admitting: Nurse Practitioner

## 2008-10-18 DIAGNOSIS — J45909 Unspecified asthma, uncomplicated: Secondary | ICD-10-CM | POA: Insufficient documentation

## 2008-10-18 DIAGNOSIS — D259 Leiomyoma of uterus, unspecified: Secondary | ICD-10-CM | POA: Insufficient documentation

## 2008-11-18 ENCOUNTER — Emergency Department (HOSPITAL_COMMUNITY): Admission: EM | Admit: 2008-11-18 | Discharge: 2008-11-18 | Payer: Self-pay | Admitting: Emergency Medicine

## 2008-11-30 ENCOUNTER — Ambulatory Visit: Payer: Self-pay | Admitting: Nurse Practitioner

## 2008-11-30 DIAGNOSIS — K625 Hemorrhage of anus and rectum: Secondary | ICD-10-CM | POA: Insufficient documentation

## 2009-09-19 ENCOUNTER — Emergency Department (HOSPITAL_COMMUNITY): Admission: EM | Admit: 2009-09-19 | Discharge: 2009-09-19 | Payer: Self-pay | Admitting: Emergency Medicine

## 2010-02-08 ENCOUNTER — Ambulatory Visit: Payer: Self-pay | Admitting: Family Medicine

## 2010-02-27 ENCOUNTER — Ambulatory Visit: Payer: Self-pay | Admitting: Family Medicine

## 2010-05-09 ENCOUNTER — Ambulatory Visit: Payer: Self-pay | Admitting: Family Medicine

## 2010-10-20 ENCOUNTER — Encounter: Payer: Self-pay | Admitting: *Deleted

## 2010-12-31 LAB — RAPID STREP SCREEN (MED CTR MEBANE ONLY): Streptococcus, Group A Screen (Direct): NEGATIVE

## 2011-02-12 NOTE — Op Note (Signed)
NAMEJATAVIA, Kaitlin Shannon             ACCOUNT NO.:  [id number]   MEDICAL RECORD NO.:  [id number]          PATIENT TYPE:  INP   LOCATION:                                [facility]   PHYSICIAN:  Ginger Carne, MD  DATE OF BIRTH:  07-14-1981   DATE OF PROCEDURE:  03/02/2008  DATE OF DISCHARGE:                               OPERATIVE REPORT   PREOPERATIVE DIAGNOSIS:  Postpartum sterilization.   POSTOPERATIVE DIAGNOSIS:  Postpartum sterilization.   PROCEDURE:  Pomeroy bilateral postpartum tubal ligation.   SURGEON:  Ginger Carne, MD   ASSISTANT:  None.   COMPLICATIONS:  None immediate.   ESTIMATED BLOOD LOSS:  Minimal.   ANESTHESIA:  Epidural top off.   SPECIMEN:  Portions of right and left tubes to pathology sent  separately.   OPERATIVE FINDINGS:  Uterus, tubes, and ovaries showed normal decidual  changes of pregnancy.  Both tubes were identified from their isthmus to  fimbriated ends, separate and apart from their respective round  ligaments.  Both ovaries appeared normal.   OPERATIVE PROCEDURE:  The patient prepped and draped in usual fashion  and placed in the supine position.  Betadine solution used for  antiseptic and the patient voided prior to the procedure.   After adequate epidural top off, a small vertical infraumbilical  incision was made and the abdomen opened.  Either tube was grasped at  the isthmus ampullary junction, 3 cm of tube were then ligated in a loop  manner twice with 2-0 plain catgut suture.  Tubes cut at above said  knots, tips cauterized, no active bleeding noted.  The proximal portion  of either tube was affixed with the suture.  Specimen sent separately to  pathology.  No active bleeding noted.  Fascia closed with 1 layer of 0-  Vicryl running suture and 3-0 Monocryl for subcuticular closures.  Instruments and sponge count were correct.  The patient tolerated the  procedure well, returned to the post anesthesia recovery room in  excellent condition.     Ginger Carne, MD  Electronically Signed    SHB/MEDQ  D:  03/02/2008  T:  03/03/2008  Job:  [id number]

## 2011-02-15 NOTE — Discharge Summary (Signed)
Kaitlin Shannon, Kaitlin Shannon             ACCOUNT NO.:  [id number]   MEDICAL RECORD NO.:  [id number]          PATIENT TYPE:  INP   LOCATION:  9113                          [facility]ILITY:  [facility]   PHYSICIAN:  Lesly Dukes, M.D. DATE OF BIRTH:  Nov 03, 1980   DATE OF ADMISSION:  08/14/2004  DATE OF DISCHARGE:  08/17/2004                                 DISCHARGE SUMMARY   ADMISSION DIAGNOSES:  71.  A 30 year old gravida 3 para 0-0-2-0 at 27 weeks in preterm labor.  2.  Urinary tract infection.   DISCHARGE DIAGNOSES:  57.  A 30 year old gravida 3 para 0-1-2-1, spontaneous vaginal delivery at 23      weeks, viable female.  2.  A 6 cm fibroid anterior uterine body.   DISCHARGE MEDICATIONS:  Motrin 600 mg p.o. q.6h. p.r.n. pain.   ADMISSION HISTORY:  [name] is a 30 year old G3 P0-1-2-1 that presented  to the maternity admissions unit on August 14, 2004 complaining of pain  which she thought were contractions and some vaginal spotting.  She also  complained of urinary frequency.  The patient was being seen in [facility] by Dr. Vita Barley with onset of prenatal care at 7 weeks.   HOSPITAL COURSE:  #1 - PRETERM LABOR.  The patient was admitted to the  antenatal unit with strict bedrest, was given betamethasone 12 mg IM q.24h.  x2.  She was started on Motrin 800 mg p.o. x1 then 400 mg q.4h., in addition  to magnesium sulfate 2 g per hour for tocolysis.  She was placed in  Trendelenburg position.  The patient continued to have some vaginal bleeding  with cervical change from 4 cm when admitted into MAU to 5 cm 8 hours later.  The patient delivered a viable female by spontaneous vaginal delivery with  Apgars 2 at one minute, 6 at five minutes, and 7 at ten minutes.  The infant  was given to NICU team for care.  On day of discharge, the patient was  stable, afebrile, good pain control with Motrin q.6h., decreasing lochia.  The patient was discharged home in stable condition.  She will follow  up in  clinic here at [facility] on Tuesday, October 02, 2004 at 2:30 p.m. for  a 6-week postpartum visit and reevaluation of fibroid.  The patient's blood  type is A positive, antibody negative, rubella immune, GBS positive, ANA  negative, cardiolipin antibody 5, lupus anticoagulant negative, hemoglobin  9.1.   #2 - URINARY TRACT INFECTION.  [facility] was contacted to  retrieve culture results from previously-diagnosed UTI by Dr. Vita Barley at  [facility].  The center was unable to find culture results.  Therefore, UA obtained in the MAU was sent for culture.  Clean catch  revealed 35,000 uropathogens.  Catheterized urine was negative for growth.   CONDITION ON DISCHARGE:  Stable.   INSTRUCTIONS GIVEN TO PATIENT:  1.  Motrin 600 mg p.o. q.6h. p.r.n. pain.  2.  Follow [facility], for postpartum check and fibroid      reevaluation.      VRE/MEDQ  D:  08/17/2004  T:  08/17/2004  Job:  [id number]   cc:   [facility] Dr. Vita Barley

## 2011-02-15 NOTE — Group Therapy Note (Signed)
NAME:  Kaitlin Shannon, Kaitlin Shannon             ACCOUNT NO.:  [id number]   MEDICAL RECORD NO.:  [id number]          PATIENT TYPE:  WOC   LOCATION:  [facility]                   FACILITY:  [facility]   PHYSICIAN:  Ellis Parents, MD    DATE OF BIRTH:  02-14-1981   DATE OF SERVICE:  01/08/2005                                    CLINIC NOTE   This patient returns because she is having breakthrough bleeding with the  Yasmin, but she has taken the pills so erratic, she is stopping the Yasmin  when she spots and has been off of it for two weeks and then resumes, and  she just does not understand that three week on, one week off regimen.  The  patient was advised to discontinue taking the Yasmin and to use condoms for  three months and then she could return and we would try another birth  control pill.      SA/MEDQ  D:  01/08/2005  T:  01/08/2005  Job:  [id number]

## 2011-02-15 NOTE — Group Therapy Note (Signed)
NAMEPEARLEAN, SABINA             ACCOUNT NO.:  [id number]   MEDICAL RECORD NO.:  [id number]          PATIENT TYPE:  WOC   LOCATION:  [facility]                   [facility]ACILITY:  [facility]   PHYSICIAN:  Jon Gills, MD    DATE OF BIRTH:  15-Feb-1981   DATE OF SERVICE:  10/02/2004                                    CLINIC NOTE   CHIEF COMPLAINT:  Followup six-week postpartum check and fibroids.   SUBJECTIVE:  Kaitlin Shannon is here today for her six-week postpartum followup.  Overall she is doing well.  She did deliver a 27-week little girl who is  still in NICU but doing well.  She is pumping and taking fenugreek for milk  production.  She denies any postpartum depression.  She states she had some  irregular spotting which is now resolved.  Denies any dizziness or  presyncopal episodes.  She states her last Pap smear was in September and  was normal.  She is taking a prenatal vitamin.   PAST MEDICAL HISTORY:  1.  Asthma.  2.  History of high blood pressure per patient.   OB HISTORY:  G2, P0-1-1-1.  She began menarche at age 61.   PAST SURGICAL HISTORY:  None.   SOCIAL AND FAMILY HISTORY:  Per the patient's gynecological history form.   OBJECTIVE:  VITAL SIGNS:  Normal.  GENERAL:  She is a well-appearing female in no acute distress.  Otherwise  exam is benign.  PELVIC:  Exam deferred.   IMPRESSION AND PLAN:  1.  Postpartum check.  We checked CBC today for hemoglobin.  She is not on      any birth control currently since she is breast feeding but states she      is going to stop breast feeding in February, so I have written her for      Ortho-Novum 135 with a one-year refill.  2.  As far as her fibroids, I told her it is too early to ultrasound her      since she is so close to being postpartum.  Will go ahead and re-      ultrasound her in six weeks to followup the fibroids noted in November.      I did explain the natural course of fibroids and may likely be very      small or  nonexistent, and even if she does have a small fibroid, as long      as she is asymptomatic, she does not need treatment.  3.  She is to follow up in eight weeks so that we can discuss her      ultrasound.    LC/MEDQ  D:  10/02/2004  T:  10/02/2004  Job:  [id number]

## 2011-02-15 NOTE — Group Therapy Note (Signed)
NAMESERYNA, Kaitlin Shannon             ACCOUNT NO.:  [id number]   MEDICAL RECORD NO.:  [id number]          PATIENT TYPE:  WOC   LOCATION:  [facility]                   [facility]   PHYSICIAN:  Kaitlin Parents, MD    DATE OF BIRTH:  Nov 06, 1980   DATE OF SERVICE:  12/11/2004                                    CLINIC NOTE   CHIEF COMPLAINT:  Patient is here for a follow-up of her Yasmin prescription  from February 2006.   SUBJECTIVE:  [name] is going better.  Last month she complained of pain  with her menses and associated nausea.  Her last menstrual period was dated  December 07, 2004 and she states that this time her painful menses had ceased  but she still had residual nausea.  She has no other complaints with her  birth control pill.   PAST MEDICAL HISTORY:  1.  Asthma.  2.  History of hypertension per patient.   PAST OBSTETRICAL HISTORY:  G2, P0-1-1-1.   OBJECTIVE:  VITAL SIGNS:  Blood pressure 110/60, pulse 80, weight 221.3  pounds, height 5 feet 7 inches.  GENERAL:  Well-appearing, in no apparent distress.  LUNGS:  Clear to auscultation bilaterally.  CARDIOVASCULAR:  Regular rate and rhythm.  EXTREMITIES:  No edema present.   ASSESSMENT/PLAN:  Painful menses.  This seems to have improved compared to  last month's menses.  Patient is concerned that she is still having  associated nausea.  She is encouraged that she has lost some weight over the  past six weeks from using her Yasmin.  The plan is to have her continue  Yasmin until her three month follow-up for her repeat Pap and the patient is  to call or come in with any further concerns.      MK/MEDQ  D:  12/11/2004  T:  12/11/2004  Job:  [id number]

## 2011-03-21 ENCOUNTER — Ambulatory Visit (INDEPENDENT_AMBULATORY_CARE_PROVIDER_SITE_OTHER): Payer: Managed Care, Other (non HMO) | Admitting: Family Medicine

## 2011-03-21 ENCOUNTER — Encounter: Payer: Self-pay | Admitting: Family Medicine

## 2011-03-21 VITALS — BP 124/72 | HR 72 | Temp 98.4°F | Ht 67.0 in | Wt 180.0 lb

## 2011-03-21 DIAGNOSIS — J029 Acute pharyngitis, unspecified: Secondary | ICD-10-CM

## 2011-03-21 DIAGNOSIS — J020 Streptococcal pharyngitis: Secondary | ICD-10-CM

## 2011-03-21 LAB — POCT RAPID STREP A (OFFICE): Rapid Strep A Screen: POSITIVE — AB

## 2011-03-21 MED ORDER — AMOXICILLIN 875 MG PO TABS: 875.0000 mg | ORAL_TABLET | Freq: Two times a day (BID) | ORAL | Status: AC

## 2011-03-21 NOTE — Progress Notes (Signed)
SUBJECTIVE: Patient presents with complaint of sore throat x 2 days.  Fever to 101 yesterday.  Slight runny nose, intermittent.  Slight cough. Some nausea earlier today.  No vomiting, diarrhea.  Denies skin rashes.  No known sick contacts.  Has 3 young children, none of whom are sick.  Past Medical History  Diagnosis Date  . Anemia   . Hypertension   . Unspecified vitamin D deficiency   . Uterine leiomyoma     Past Surgical History  Procedure Date  . Tubal ligation     History   Social History  . Marital Status: Single    Spouse Name: N/A    Number of Children: N/A  . Years of Education: N/A   Occupational History  . Not on file.   Social History Main Topics  . Smoking status: Never Smoker   . Smokeless tobacco: Not on file  . Alcohol Use: Yes     once or twice a year  . Drug Use: No  . Sexually Active: Not on file   Other Topics Concern  . Not on file   Social History Narrative  . No narrative on file    Family History  Problem Relation Age of Onset  . COPD Mother    No current outpatient prescriptions on file prior to visit.   No Known Allergies  ROS: Denies fevers, rash, GI complaints, GU complaints, chest pain, or other concerns  PHYSICAL EXAM: HEENT:  PERRL, EOMI, conjunctiva clear.  TM's and EAC's normal.  OP with erythema of anterior tonsillar pillars and tonsils, no exudates.  Mucus membranes moist.  Sinuses nontender. Normal nose without drainage.  Neck:  Tender shotty anterior cervical lymphadenopathy Heart:  Regular rate and rhythm Lungs: clear bilaterally Extremities:  No cyanosis or edema Skin:  No rash  ASSESSMENT/PLAN: 1. Sore throat  POCT rapid strep A  2. Strep pharyngitis  amoxicillin (AMOXIL) 875 MG tablet   Understands the need to complete entire course of ABX .  F/U if fevers, sore throat, symptoms persist/worsen after 48 hours.  Discussed contagious nature of strep throat (until on ABX x 24 hrs), and recommended changing toothbrush  tomorrow

## 2011-03-21 NOTE — Patient Instructions (Signed)
Strep Throat, Adult Strep throat is an infection of the throat caused by a germ (bacteria). A bacteria is a type of tiny living thing that may cause disease. It is most common in late winter and early spring but can happen any time of the year. For patients who have not had their tonsils removed before, this germ can cause Strep tonsillitis. If someone has had the tonsils removed, they can still get Strep throat. Strep throat is contagious and can spread through coughing or sneezing and other close contact with someone who has this problem. SYMPTOMS Common symptoms may include:  Fever.   Painful, red tonsils and/or throat.   White or yellow spots on tonsils and/or throat.   Swollen, tender lymph nodes or "glands" of the neck and/or under the jaw.   Red rash all over the body (uncommon).  DIAGNOSIS In most cases, a "rapid strep test" can help your caregiver make the diagnosis in a few minutes. If this test is not available, a light swab of infected area can be used for a throat culture to see if Strep bacteria are present. The results of a throat culture take about 2 days. TREATMENT Strep throat is generally treated with antibiotic medicine. HOME CARE INSTRUCTIONS  Gargle with 1 teaspoon of salt in 1 cup of warm water, 3 to 4 times per day or as necessary for comfort.   Family members with a sore throat or fever should have a medical examination or throat culture. If there has been a positive throat culture in the family, your caregiver may treat the rest of the family without seeing them. This depends upon his knowledge of their condition and his familiarity with you and your family.   You may return to work when you feel able.   Only take over-the-counter or prescription medicines for pain, discomfort or fever as directed by your caregiver.  SEEK MEDICAL CARE IF:  You have an oral temperature above 102 (after 48 hours on antibiotics).   You develop large glands in your neck.   You  develop a rash, cough or earache.   You cough up green, yellow-brown or bloody sputum.   You have pain or discomfort not controlled by medications or if problems seem to be getting worse rather than better.  SEEK IMMEDIATE MEDICAL CARE IF:  You develop any new symptoms such as vomiting, severe headache, stiff or painful neck, chest pain, shortness of breath, trouble breathing or swallowing.   You develop severe throat pain, drooling or changes in voice.   You develop swelling of the neck, or the skin on the neck becomes red and tender.   You have an oral temperature above 104, not controlled by medicine.  Document Released: 09/13/2000 Document Re-Released: 12/11/2009 [facility] Patient Information 2011 [place], [place].

## 2011-05-28 ENCOUNTER — Ambulatory Visit (INDEPENDENT_AMBULATORY_CARE_PROVIDER_SITE_OTHER): Payer: Managed Care, Other (non HMO) | Admitting: Medical

## 2011-05-28 ENCOUNTER — Encounter: Payer: Self-pay | Admitting: Medical

## 2011-05-28 VITALS — BP 120/72 | HR 72 | Temp 98.2°F | Resp 20 | Ht 67.0 in | Wt 182.0 lb

## 2011-05-28 DIAGNOSIS — L608 Other nail disorders: Secondary | ICD-10-CM

## 2011-05-28 DIAGNOSIS — D649 Anemia, unspecified: Secondary | ICD-10-CM

## 2011-05-28 DIAGNOSIS — R5383 Other fatigue: Secondary | ICD-10-CM

## 2011-05-28 DIAGNOSIS — R5381 Other malaise: Secondary | ICD-10-CM

## 2011-05-28 DIAGNOSIS — R51 Headache: Secondary | ICD-10-CM

## 2011-05-28 DIAGNOSIS — L605 Yellow nail syndrome: Secondary | ICD-10-CM

## 2011-05-28 LAB — COMPREHENSIVE METABOLIC PANEL
ALT: 11 U/L (ref 0–35)
Albumin: 4.2 g/dL (ref 3.5–5.2)
CO2: 24 mEq/L (ref 19–32)
Chloride: 105 mEq/L (ref 96–112)
Glucose, Bld: 79 mg/dL (ref 70–99)
Potassium: 4.1 mEq/L (ref 3.5–5.3)
Sodium: 139 mEq/L (ref 135–145)
Total Protein: 6.9 g/dL (ref 6.0–8.3)

## 2011-05-28 LAB — POCT URINALYSIS DIPSTICK
Bilirubin, UA: NEGATIVE
Blood, UA: NEGATIVE
Glucose, UA: NEGATIVE
Nitrite, UA: NEGATIVE

## 2011-05-28 LAB — TSH: TSH: 1.17 u[IU]/mL (ref 0.350–4.500)

## 2011-05-28 NOTE — Progress Notes (Signed)
Subjective:   HPI  Kaitlin Shannon is a 30 y.o. female who presents for 2 day hx/o yellow colored nails.   She notes in the past few weeks getting some headaches, feels tired all the time, sluggish, but otherwise has been in her normal state of health.  Denies alcohol use, no hx/o liver disease or jaundice.  She does have hx/o anemia, was on iron in the past, but no iron in a long time.  She was last here for labs over a year ago.  She does note heavy periods, requiring 2 pads per hour, but otherwise periods are regular. Denies any other bruising or bleeding issues.  No other aggravating or relieving factors.  No other c/o.  The following portions of the patient's history were reviewed and updated as appropriate: allergies, current medications, past family history, past medical history, past social history, past surgical history and problem list.  Past Medical History  Diagnosis Date  . Anemia   . Hypertension   . Unspecified vitamin D deficiency   . Uterine leiomyoma   . Astigmatism   . Incompetent cervix     Review of Systems Constitutional: +fatigue; denies fever, chills, sweats, unexpected weight change,  Cardiology: denies chest pain, palpitations, edema Respiratory: denies cough, shortness of breath, wheezing Gastroenterology: denies abdominal pain, nausea, vomiting, diarrhea, constipation Musculoskeletal: denies arthralgias, myalgias, back pain Urology: denies dysuria, difficulty urinating, hematuria, urinary frequency, urgency      Objective:   Physical Exam  General appearance: alert, no distress, WD/WN, black female Skin: first 3 fingers of each hand with yellow appearing nails, otherwise fingernails normal, toenails have nail polish, but no other signs of jaundice, no bruising HEENT: normocephalic, sclerae anicteric, nares patent, no discharge or erythema, pharynx normal Oral cavity: MMM, no lesions Neck: supple, no lymphadenopathy, no thyromegaly, no masses Heart: RRR,  normal S1, S2, no murmurs Lungs: CTA bilaterally, no wheezes, rhonchi, or rales Abdomen: +bs, soft, non tender, non distended, no masses, no hepatomegaly, no splenomegaly Extremities: no edema, no cyanosis, no clubbing Pulses: 2+ symmetric, upper and lower extremities, normal cap refill   Assessment :    Encounter Diagnoses  Name Primary?  Marland Kitchen Anemia Yes  . Yellow nails   . Fatigue   . Headache      Plan:    I suspect her symptoms are related to her known anemia and heavy menstrual bleeding.  I reviewed prior labs from 5/11 with a hemoglobin of 9.7, microcytic anemia on CBC.  We will check labs today, and we will call with results and plan.

## 2011-05-29 ENCOUNTER — Other Ambulatory Visit: Payer: Self-pay | Admitting: Medical

## 2011-05-29 ENCOUNTER — Telehealth: Payer: Self-pay | Admitting: *Deleted

## 2011-05-29 LAB — CBC WITH DIFFERENTIAL/PLATELET
Eosinophils Absolute: 0.1 10*3/uL (ref 0.0–0.7)
Hemoglobin: 8.3 g/dL — ABNORMAL LOW (ref 12.0–15.0)
Lymphocytes Relative: 38 % (ref 12–46)
Lymphs Abs: 1.4 10*3/uL (ref 0.7–4.0)
MCH: 19.9 pg — ABNORMAL LOW (ref 26.0–34.0)
Monocytes Relative: 16 % — ABNORMAL HIGH (ref 3–12)
Neutro Abs: 1.7 10*3/uL (ref 1.7–7.7)
Neutrophils Relative %: 45 % (ref 43–77)
RBC: 4.18 MIL/uL (ref 3.87–5.11)
WBC: 3.7 10*3/uL — ABNORMAL LOW (ref 4.0–10.5)

## 2011-05-29 MED ORDER — FERROUS GLUCONATE 324 (38 FE) MG PO TABS: ORAL_TABLET | ORAL | Status: DC

## 2011-05-29 NOTE — Telephone Encounter (Addendum)
Message copied by Dorthula Perfect on Wed May 29, 2011  1:24 PM ------      Message from: Aleen Campi, DAVID S      Created: Wed May 29, 2011  8:46 AM       As suspected, her anemia is worse.  Otherwise labs ok.  I sent iron (ferrous gluconate) to pharmacy.  I want her to begin iron today.  Take it twice daily with orange juice or food.   Take some OTC Colace once daily as needed as this medication may make her constipated.  Lets see her back in 29mo.              Also given the fibroids and heavy menstruation, lets refer to gynecology for consult.  If no preference, can use Dr. Noland Fordyce at [facility].   Pt advised of lab results.  Pt was informed to take rx iron twice daily with orange juice or food and to also get OTC Colace to take once daily for constipation.  Pt is scheduled to see Dr. Ernestina Penna on 06-17-11 at 11:30 am and pt is aware.  Faxed office note and labs to Dr. Ernestina Penna for review.  Pt will call to schedule a 1 month follow up soon.  CM, LPN

## 2011-05-30 ENCOUNTER — Other Ambulatory Visit: Payer: Self-pay

## 2011-05-31 ENCOUNTER — Telehealth: Payer: Self-pay | Admitting: Medical

## 2011-05-31 NOTE — Telephone Encounter (Signed)
Done.  CM, LPN

## 2011-06-04 ENCOUNTER — Ambulatory Visit (INDEPENDENT_AMBULATORY_CARE_PROVIDER_SITE_OTHER): Payer: Managed Care, Other (non HMO) | Admitting: Family Medicine

## 2011-06-04 ENCOUNTER — Encounter: Payer: Self-pay | Admitting: Family Medicine

## 2011-06-04 VITALS — BP 130/76 | HR 75 | Wt 179.0 lb

## 2011-06-04 DIAGNOSIS — M25562 Pain in left knee: Secondary | ICD-10-CM

## 2011-06-04 DIAGNOSIS — M25569 Pain in unspecified knee: Secondary | ICD-10-CM

## 2011-06-04 NOTE — Progress Notes (Signed)
  Subjective:    Patient ID: Kaitlin Shannon, female    DOB: Feb 17, 1981, 30 y.o.   MRN: [id number]  HPI A two-day history of left hip pain radiating to the knee. Presently she is only having knee pain. No history of injury. She does occasionally feel weakness in her left leg  Review of Systems     Objective:   Physical Exam Full motion of the hip without pain. No palpable tenderness to the thigh. Exam of her knee shows no effusion with normal motion. Anterior drawer and McMurray's testing and negative.       Assessment & Plan:  Hip and leg pain, etiology unclear. Recommend conservative care with Aleve twice per day. Call if symptoms continue. I also discussed weight management with her in regard to diet and exercise. Strongly encouraged her to cut back on her carbohydrates.

## 2011-06-04 NOTE — Patient Instructions (Signed)
Take 2 Aleve twice a day for the next 7-10 days. If you're still having difficulty, come back here

## 2011-06-19 LAB — POCT URINALYSIS DIP (DEVICE)
Operator id: 194561
Operator id: 297281
Protein, ur: NEGATIVE
Protein, ur: NEGATIVE
Specific Gravity, Urine: 1.02
Urobilinogen, UA: 0.2
Urobilinogen, UA: 0.2
pH: 6.5
pH: 7

## 2011-06-20 LAB — POCT URINALYSIS DIP (DEVICE)
Ketones, ur: NEGATIVE
Operator id: 120861
Protein, ur: 30 — AB
Specific Gravity, Urine: 1.025
Urobilinogen, UA: 0.2
pH: 7

## 2011-06-21 LAB — POCT URINALYSIS DIP (DEVICE)
Bilirubin Urine: NEGATIVE
Glucose, UA: NEGATIVE
Hgb urine dipstick: NEGATIVE
Ketones, ur: NEGATIVE
Nitrite: NEGATIVE
Nitrite: NEGATIVE
Operator id: 148111

## 2011-06-24 LAB — POCT URINALYSIS DIP (DEVICE)
Bilirubin Urine: NEGATIVE
Bilirubin Urine: NEGATIVE
Bilirubin Urine: NEGATIVE
Glucose, UA: NEGATIVE
Glucose, UA: NEGATIVE
Glucose, UA: NEGATIVE
Ketones, ur: NEGATIVE
Ketones, ur: NEGATIVE
Ketones, ur: NEGATIVE
Nitrite: NEGATIVE
Nitrite: NEGATIVE
Nitrite: NEGATIVE
Operator id: 159681
Operator id: 297281
pH: 7
pH: 6.5
pH: 7

## 2011-06-25 LAB — POCT URINALYSIS DIP (DEVICE)
Bilirubin Urine: NEGATIVE
Bilirubin Urine: NEGATIVE
Bilirubin Urine: NEGATIVE
Ketones, ur: NEGATIVE
Nitrite: NEGATIVE
Nitrite: NEGATIVE
Nitrite: NEGATIVE
Nitrite: NEGATIVE
Operator id: 194561
Operator id: 194561
Operator id: 194561
Operator id: 297281
Protein, ur: NEGATIVE
Protein, ur: NEGATIVE
Protein, ur: NEGATIVE
pH: 7
pH: 7
pH: 7
pH: 8

## 2011-06-26 LAB — POCT URINALYSIS DIP (DEVICE)
Nitrite: NEGATIVE
Protein, ur: NEGATIVE
Urobilinogen, UA: 0.2
pH: 7

## 2011-06-27 LAB — CBC
HCT: 32 — ABNORMAL LOW
Hemoglobin: 9.8 — ABNORMAL LOW
MCHC: 34.9
Platelets: 198
RDW: 14.7
RDW: 15

## 2011-06-27 LAB — COMPREHENSIVE METABOLIC PANEL
ALT: 13
AST: 16
CO2: 22
Calcium: 9
Chloride: 102
Creatinine, Ser: 0.57
GFR calc non Af Amer: >60
Glucose, Bld: 91
Total Bilirubin: 0.6

## 2011-06-27 LAB — POCT URINALYSIS DIP (DEVICE)
Nitrite: NEGATIVE
Protein, ur: NEGATIVE
Urobilinogen, UA: 1
pH: 7.5

## 2011-06-27 LAB — URIC ACID: Uric Acid, Serum: 3.4

## 2011-07-05 LAB — URINALYSIS, ROUTINE W REFLEX MICROSCOPIC
Hgb urine dipstick: NEGATIVE
Nitrite: NEGATIVE
Protein, ur: NEGATIVE
Specific Gravity, Urine: 1.02
Urobilinogen, UA: 0.2

## 2011-07-05 LAB — WET PREP, GENITAL
Clue Cells Wet Prep HPF POC: NONE SEEN
Trich, Wet Prep: NONE SEEN
Yeast Wet Prep HPF POC: NONE SEEN

## 2011-07-05 LAB — GC/CHLAMYDIA PROBE AMP, GENITAL
Chlamydia, DNA Probe: NEGATIVE
GC Probe Amp, Genital: NEGATIVE

## 2011-07-05 LAB — URINE CULTURE

## 2011-07-05 LAB — URINE MICROSCOPIC-ADD ON

## 2011-07-08 LAB — POCT URINALYSIS DIP (DEVICE)
Glucose, UA: NEGATIVE
Hgb urine dipstick: NEGATIVE
Nitrite: NEGATIVE
Protein, ur: NEGATIVE
Specific Gravity, Urine: 1.015
Urobilinogen, UA: 1
pH: 8.5 — ABNORMAL HIGH

## 2011-07-09 LAB — POCT URINALYSIS DIP (DEVICE)
Ketones, ur: NEGATIVE
Protein, ur: NEGATIVE
Specific Gravity, Urine: 1.015
Urobilinogen, UA: 0.2
pH: 7.5

## 2011-07-09 LAB — URINALYSIS, ROUTINE W REFLEX MICROSCOPIC
Bilirubin Urine: NEGATIVE
Glucose, UA: NEGATIVE
Hgb urine dipstick: NEGATIVE
Protein, ur: NEGATIVE
Urobilinogen, UA: 0.2

## 2011-07-09 LAB — WET PREP, GENITAL
Trich, Wet Prep: NONE SEEN
Yeast Wet Prep HPF POC: NONE SEEN

## 2011-07-09 LAB — GC/CHLAMYDIA PROBE AMP, GENITAL: Chlamydia, DNA Probe: NEGATIVE

## 2011-07-09 LAB — POCT PREGNANCY, URINE: Preg Test, Ur: POSITIVE

## 2011-10-26 ENCOUNTER — Encounter (HOSPITAL_COMMUNITY): Payer: Self-pay | Admitting: *Deleted

## 2011-10-26 ENCOUNTER — Emergency Department (HOSPITAL_COMMUNITY)
Admission: EM | Admit: 2011-10-26 | Discharge: 2011-10-26 | Disposition: A | Discharge status: Home / Self Care | Payer: Self-pay | Attending: Emergency Medicine | Admitting: Emergency Medicine

## 2011-10-26 DIAGNOSIS — J3489 Other specified disorders of nose and nasal sinuses: Secondary | ICD-10-CM | POA: Insufficient documentation

## 2011-10-26 DIAGNOSIS — I10 Essential (primary) hypertension: Secondary | ICD-10-CM | POA: Insufficient documentation

## 2011-10-26 DIAGNOSIS — J029 Acute pharyngitis, unspecified: Secondary | ICD-10-CM | POA: Insufficient documentation

## 2011-10-26 DIAGNOSIS — J069 Acute upper respiratory infection, unspecified: Secondary | ICD-10-CM | POA: Insufficient documentation

## 2011-10-26 LAB — RAPID STREP SCREEN (MED CTR MEBANE ONLY): Streptococcus, Group A Screen (Direct): NEGATIVE

## 2011-10-26 NOTE — ED Notes (Signed)
Patient with sore throat and it hurts and nasal congestion

## 2011-10-26 NOTE — ED Provider Notes (Signed)
History     CSN: [id number]  Arrival date & time 10/26/11  1601   First MD Initiated Contact with Patient 10/26/11 1626      Chief Complaint  Patient presents with  . Sore Throat    (Consider location/radiation/quality/duration/timing/severity/associated sxs/prior treatment) Patient is a 31 y.o. female presenting with pharyngitis. The history is provided by the patient.  Sore Throat This is a new problem. The current episode started in the past 7 days. The problem occurs constantly. The problem has been gradually worsening. Associated symptoms include congestion and a sore throat. Pertinent negatives include no coughing, fever, nausea, neck pain or vomiting. The symptoms are aggravated by swallowing. She has tried acetaminophen for the symptoms.    Past Medical History  Diagnosis Date  . Anemia   . Hypertension   . Unspecified vitamin D deficiency   . Uterine leiomyoma   . Astigmatism   . Incompetent cervix     Past Surgical History  Procedure Date  . Tubal ligation     Family History  Problem Relation Age of Onset  . COPD Mother     History  Substance Use Topics  . Smoking status: Never Smoker   . Smokeless tobacco: Not on file  . Alcohol Use: Yes     once or twice a year    OB History    Grav Para Term Preterm Abortions TAB SAB Ect Mult Living                  Review of Systems  Constitutional: Negative.  Negative for fever.  HENT: Positive for congestion and sore throat. Negative for neck pain.   Eyes: Negative.   Respiratory: Negative.  Negative for cough.   Cardiovascular: Negative.   Gastrointestinal: Negative.  Negative for nausea and vomiting.  Genitourinary: Negative.   Musculoskeletal: Negative.   Skin: Negative.   Neurological: Negative.   Hematological: Negative.   Psychiatric/Behavioral: Negative.     Allergies  Review of patient's allergies indicates no known allergies.  Home Medications   Current Outpatient Rx  Name Route Sig  Dispense Refill  . PHENOL 1.4 % MT LIQD Mouth/Throat Use as directed 1 spray in the mouth or throat as needed. For sore throat.      BP 127/73  Pulse 72  Temp(Src) 98.2 F (36.8 C) (Oral)  Resp 16  SpO2 100%  Physical Exam  Constitutional: She is oriented to person, place, and time. She appears well-developed and well-nourished.  HENT:  Head: Normocephalic and atraumatic.  Right Ear: Hearing, tympanic membrane, external ear and ear canal normal.  Left Ear: Hearing, tympanic membrane, external ear and ear canal normal.  Nose: Nose normal.  Mouth/Throat: Uvula is midline and mucous membranes are normal. Posterior oropharyngeal erythema present. No oropharyngeal exudate, posterior oropharyngeal edema or tonsillar abscesses.       Mild pharyngeal erythema  Eyes: Conjunctivae are normal.  Neck: Neck supple.  Cardiovascular: Normal rate and regular rhythm.   Pulmonary/Chest: Effort normal and breath sounds normal.  Abdominal: Soft. Bowel sounds are normal.  Musculoskeletal: Normal range of motion.  Neurological: She is alert and oriented to person, place, and time.  Skin: Skin is warm and dry. No rash noted. No erythema.  Psychiatric: She has a normal mood and affect.    ED Course  Procedures Findings and clinical impression discussed w/ pt. Will plan for d/c home w/ instructions for viral pharyngitis and encourage f/u w/ PCP if not improving. Pt agreeable w/ plan.  Labs Reviewed  RAPID STREP SCREEN   No results found.   No diagnosis found.    MDM  HPI/PE and clinical findings c/w viral pharyngitis/URI        Leanne Chang, NP 10/26/11 1735

## 2011-10-27 NOTE — ED Provider Notes (Signed)
Medical screening examination/treatment/procedure(s) were performed by non-physician practitioner and as supervising physician I was immediately available for consultation/collaboration.   Shanoah Asbill E Chizuko Trine, MD 10/27/11 1030 

## 2011-11-21 ENCOUNTER — Emergency Department (HOSPITAL_COMMUNITY)
Admission: EM | Admit: 2011-11-21 | Discharge: 2011-11-21 | Disposition: A | Discharge status: Home / Self Care | Payer: Self-pay | Attending: Emergency Medicine | Admitting: Emergency Medicine

## 2011-11-21 ENCOUNTER — Encounter (HOSPITAL_COMMUNITY): Payer: Self-pay | Admitting: *Deleted

## 2011-11-21 ENCOUNTER — Emergency Department (HOSPITAL_COMMUNITY): Payer: Self-pay

## 2011-11-21 ENCOUNTER — Other Ambulatory Visit: Payer: Self-pay

## 2011-11-21 DIAGNOSIS — R079 Chest pain, unspecified: Secondary | ICD-10-CM | POA: Insufficient documentation

## 2011-11-21 DIAGNOSIS — R0602 Shortness of breath: Secondary | ICD-10-CM | POA: Insufficient documentation

## 2011-11-21 DIAGNOSIS — R071 Chest pain on breathing: Secondary | ICD-10-CM | POA: Insufficient documentation

## 2011-11-21 DIAGNOSIS — D649 Anemia, unspecified: Secondary | ICD-10-CM

## 2011-11-21 DIAGNOSIS — I10 Essential (primary) hypertension: Secondary | ICD-10-CM | POA: Insufficient documentation

## 2011-11-21 DIAGNOSIS — R0789 Other chest pain: Secondary | ICD-10-CM

## 2011-11-21 LAB — CBC
Hemoglobin: 8.4 g/dL — ABNORMAL LOW (ref 12.0–15.0)
MCH: 20.4 pg — ABNORMAL LOW (ref 26.0–34.0)
MCHC: 31.7 g/dL (ref 30.0–36.0)

## 2011-11-21 LAB — BASIC METABOLIC PANEL
BUN: 14 mg/dL (ref 6–23)
Calcium: 9.8 mg/dL (ref 8.4–10.5)
GFR calc non Af Amer: >90 mL/min (ref >90)
Glucose, Bld: 93 mg/dL (ref 70–99)
Sodium: 137 mEq/L (ref 135–145)

## 2011-11-21 LAB — POCT I-STAT TROPONIN I

## 2011-11-21 MED ORDER — ASPIRIN 325 MG PO TABS
325.0000 mg | ORAL_TABLET | ORAL | Status: AC
  Administered 2011-11-21: 325 mg via ORAL
  Filled 2011-11-21: qty 1

## 2011-11-21 MED ORDER — HYDROCODONE-ACETAMINOPHEN 5-325 MG PO TABS
1.0000 | ORAL_TABLET | Freq: Once | ORAL | Status: AC
  Administered 2011-11-21: 1 via ORAL
  Filled 2011-11-21: qty 1

## 2011-11-21 MED ORDER — NITROGLYCERIN 0.4 MG SL SUBL: 0.4000 mg | SUBLINGUAL_TABLET | SUBLINGUAL | Status: DC | PRN

## 2011-11-21 MED ORDER — FERROUS SULFATE 325 (65 FE) MG PO TABS: 325.0000 mg | ORAL_TABLET | Freq: Every day | ORAL | Status: DC

## 2011-11-21 NOTE — ED Notes (Signed)
Patient complaining of chest pain while cleaning this house this evening; patient states that the pain started off as severe and started on the right side of her chest.  Denies radiation of pain.  Patient states that she is still experiencing "discomfort" in that area, but that the pain has decreased in severity.  Patient currently rates pain 7/10 on the numerical pain scale; describes pain as "stagnant"; states that the pain worsens upon inspiration.  Patient denies any cardiac or respiratory history.  Patient alert and oriented x4; PERRL present.  Upon arrival to room, patient changed into gown and connected to continuous cardiac, pulse ox, and blood pressure monitor.  Will continue to monitor.

## 2011-11-21 NOTE — ED Notes (Signed)
rx x 1, pt voiced understanding to f/u with PCP for recheck of anemia.

## 2011-11-21 NOTE — ED Notes (Signed)
Pt was bending over while she was cleaning and she had a sudden sharp chest pain with sob.  Pt notes recent bilateral lower extremity swelling.  No acute distress at this time.  No n/v or diaphoresis with this

## 2011-11-21 NOTE — ED Notes (Signed)
Lab at bedside

## 2011-11-21 NOTE — ED Provider Notes (Signed)
History     CSN: [id number]  Arrival date & time 11/21/11  7829   First MD Initiated Contact with Patient 11/21/11 2119      Chief Complaint  Patient presents with  . Shortness of Breath     HPI  History provided by the patient. Patient is a 31 year old female with history of hypertension presents with complaints of acute onset of sharp stabbing right chest pains after bending down around 5 PM this evening. Pain has slowly been improving since that time. She has not done anything for her symptoms. Symptoms are aggravated with deep breaths and movements of right upper arm. She denies any other aggravating or alleviating factors. Symptoms were initially associated with shortness of breath. Patient denies having any nausea or diaphoresis. Patient denies doing any strenuous activity besides just bending over. Patient has no history of cancer, no recent surgery, is a nonsmoker, is not on any form of estrogen or birth control, no history of DVT, no hemoptysis, no recent travel.   Past Medical History  Diagnosis Date  . Anemia   . Hypertension   . Unspecified vitamin D deficiency   . Uterine leiomyoma   . Astigmatism   . Incompetent cervix     Past Surgical History  Procedure Date  . Tubal ligation     Family History  Problem Relation Age of Onset  . COPD Mother     History  Substance Use Topics  . Smoking status: Never Smoker   . Smokeless tobacco: Not on file  . Alcohol Use: Yes     once or twice a year    OB History    Grav Para Term Preterm Abortions TAB SAB Ect Mult Living                  Review of Systems  Constitutional: Negative for fever and chills.  Respiratory: Negative for cough.   Cardiovascular: Negative for chest pain.  All other systems reviewed and are negative.    Allergies  Review of patient's allergies indicates no known allergies.  Home Medications   Current Outpatient Rx  Name Route Sig Dispense Refill  . ACETAMINOPHEN 500 MG PO TABS  Oral Take 500 mg by mouth every 6 (six) hours as needed. For pain    . PHENOL 1.4 % MT LIQD Mouth/Throat Use as directed 1 spray in the mouth or throat as needed. For sore throat.      BP 132/85  Pulse 67  Temp(Src) 98.1 F (36.7 C) (Oral)  Resp 18  SpO2 100%  LMP 11/10/2011  Physical Exam  Nursing note and vitals reviewed. Constitutional: She is oriented to person, place, and time. She appears well-developed and well-nourished. No distress.  HENT:  Head: Normocephalic and atraumatic.  Cardiovascular: Normal rate and regular rhythm.   No murmur heard. Pulmonary/Chest: Effort normal and breath sounds normal. No respiratory distress. She has no wheezes. She has no rales. She exhibits tenderness.       Reproducible right chest wall and right sternal tenderness. No deformities.  Abdominal: Soft. Bowel sounds are normal. She exhibits no distension. There is no tenderness.  Neurological: She is alert and oriented to person, place, and time.  Skin: Skin is warm and dry. No rash noted.  Psychiatric: She has a normal mood and affect. Her behavior is normal.    ED Course  Procedures    Results for orders placed during the hospital encounter of 11/21/11  CBC      Component  Value Range   WBC 4.6  4.0 - 10.5 (K/uL)   RBC 4.12  3.87 - 5.11 (MIL/uL)   Hemoglobin 8.4 (*) 12.0 - 15.0 (g/dL)   HCT 16.1 (*) 09.6 - 46.0 (%)   MCV 64.3 (*) 78.0 - 100.0 (fL)   MCH 20.4 (*) 26.0 - 34.0 (pg)   MCHC 31.7  30.0 - 36.0 (g/dL)   RDW 04.5 (*) 40.9 - 15.5 (%)   Platelets 403 (*) 150 - 400 (K/uL)  BASIC METABOLIC PANEL      Component Value Range   Sodium 137  135 - 145 (mEq/L)   Potassium 3.6  3.5 - 5.1 (mEq/L)   Chloride 103  96 - 112 (mEq/L)   CO2 24  19 - 32 (mEq/L)   Glucose, Bld 93  70 - 99 (mg/dL)   BUN 14  6 - 23 (mg/dL)   Creatinine, Ser 8.11  0.50 - 1.10 (mg/dL)   Calcium 9.8  8.4 - 91.4 (mg/dL)   GFR calc non Af Amer >90  >90 (mL/min)   GFR calc Af Amer >90  >90 (mL/min)  POCT  I-STAT TROPONIN I      Component Value Range   Troponin i, poc 0.00  0.00 - 0.08 (ng/mL)   Comment 3               Dg Chest 2 View  11/21/2011  *RADIOLOGY REPORT*  Clinical Data: Chest pain and shortness of breath.  CHEST - 2 VIEW  Comparison: 09/19/2009.  Findings: Normal sized heart.  Clear lungs with normal vascularity. Minimal scoliosis.  IMPRESSION: No acute abnormality.  Original Report Authenticated By: Darrol Angel, M.D.     1. Musculoskeletal chest pain   2. Anemia       MDM  10:00PM patient seen and evaluated. Patient no acute distress. Pt is PERC negative.       Date: 11/21/2011  Rate: 60  Rhythm: normal sinus rhythm  QRS Axis: normal  Intervals: normal  ST/T Wave abnormalities: normal  Conduction Disutrbances:none  Narrative Interpretation:   Old EKG Reviewed: none available    Angus Seller, [place] 11/22/11 [phone]

## 2011-11-21 NOTE — Discharge Instructions (Signed)
You were seen and evaluated today for your symptoms of chest pain. This time your lab tests, EKG and chest x-ray did not show any concerning or emergent cause your symptoms. Your lab tests do show that you are anemic. He been given a prescription for iron pills to help improve your anemia. Please followup with your primary care provider next week to have a recheck of your blood tests.  Iron Deficiency Anemia There are many types of anemia. Iron deficiency anemia is the most common. Iron deficiency anemia is a decrease in the number of red blood cells caused by too little iron. Without enough iron, your body does not produce enough hemoglobin. Hemoglobin is a substance in red blood cells that carries oxygen to the body's tissues. Iron deficiency anemia may leave you tired and short of breath. CAUSES   Lack of iron in the diet.   This may be seen in infants and children, because there is little iron in milk.   This may be seen in adults who do not eat enough iron-rich foods.   This may be seen in pregnant or breastfeeding women who do not take iron supplements. There is a much higher need for iron intake at these times.   Poor absorption of iron, as seen with intestinal disorders.   Intestinal bleeding.   Heavy periods.  SYMPTOMS  Mild anemia may not be noticeable. Symptoms may include:  Fatigue.   Headache.   Pale skin.   Weakness.   Shortness of breath.   Dizziness.   Cold hands and feet.   Fast or irregular heartbeat.  DIAGNOSIS  Diagnosis requires a thorough evaluation and physical exam by your caregiver.  Blood tests are generally used to confirm iron deficiency anemia.   Additional tests may be done to find the underlying cause of your anemia. These may include:   Testing for blood in the stool (fecal occult blood test).   A procedure to see inside the colon and rectum (colonoscopy).   A procedure to see inside the esophagus and stomach (endoscopy).  TREATMENT    Correcting the cause of the iron deficiency is the first step.   Medicines, such as oral contraceptives, can make heavy menstrual flows lighter.   Antibiotics and other medicines can be used to treat peptic ulcers.   Surgery may be needed to remove a bleeding polyp, tumor, or fibroid.   Often, iron supplements (ferrous sulfate) are taken.   For the best iron absorption, take these supplements with an empty stomach.   You may need to take the supplements with food if you cannot tolerate them on an empty stomach. Vitamin C improves the absorption of iron. Your caregiver may recommend taking your iron tablets with a glass of orange juice or vitamin C supplement.   Milk and antacids should not be taken at the same time as iron supplements. They may interfere with the absorption of iron.   Iron supplements can cause constipation. A stool softener is often recommended.   Pregnant and breastfeeding women will need to take extra iron, because their normal diet usually will not provide the required amount.   Patients who cannot tolerate iron by mouth can take it through a vein (intravenously) or by an injection into the muscle.  HOME CARE INSTRUCTIONS   Ask your dietitian for help with diet questions.   Take iron and vitamins as directed by your caregiver.   Eat a diet rich in iron. Eat liver, lean beef, whole-grain bread, eggs,  dried fruit, and dark green leafy vegetables.  SEEK IMMEDIATE MEDICAL CARE IF:   You have a fainting episode. Do not drive yourself. Call your local emergency services (911 in [place] if no other help is available.   You have chest pain, nausea, or vomiting.   You develop severe or increased shortness of breath with activities.   You develop weakness or increased thirst.   You have a rapid heartbeat.   You develop unexplained sweating or become lightheaded when getting up from a chair or bed.  MAKE SURE YOU:   Understand these instructions.   Will watch  your condition.   Will get help right away if you are not doing well or get worse.  Document Released: 09/13/2000 Document Revised: 05/29/2011 Document Reviewed: 01/23/2010 [facility] Patient Information 2012 [place], [place].   Chest Pain, Nonspecific Today you have had an exam and tests to determine a specific cause for your chest pain. It is often hard to give a specific diagnosis as the cause of one's chest pain. There is always a chance that your pain could be related to something serious, like a heart attack or a blood clot in the lungs. You need to follow up with your caregiver for further evaluation. More lab tests or other studies such as x-rays, an electrocardiogram, stress testing, or cardiac imaging may be needed to find the cause of your pain. Most of the time nonspecific chest pain will be improved within 2-3 days of rest and mild pain medicine. For the next few days avoid physical exertion or activities that bring on the pain. Do not smoke or drink alcohol until all your symptoms are gone. Quitting smoking is the number one way to reduce your risk for heart and lung disease. Call your caregiver for routine follow-up as advised.  SEEK IMMEDIATE MEDICAL CARE IF:  You develop increased chest pain, or pain that radiates to the arm, neck, jaw, back or abdomen.   You develop shortness of breath, increasing cough or coughing up blood.   You have severe back or abdominal pain, nausea or vomiting.   You develop severe weakness, fainting, fever or chills.  Document Released: 09/16/2005 Document Revised: 05/29/2011 Document Reviewed: 03/06/2007 [facility] Patient Information 2012 [place], [place].

## 2011-11-23 NOTE — ED Provider Notes (Signed)
Medical screening examination/treatment/procedure(s) were performed by non-physician practitioner and as supervising physician I was immediately available for consultation/collaboration.  Pauline Trainer L Iyesha Such, MD 11/23/11 1238 

## 2012-11-23 ENCOUNTER — Ambulatory Visit (INDEPENDENT_AMBULATORY_CARE_PROVIDER_SITE_OTHER): Payer: No Typology Code available for payment source | Admitting: Medical

## 2012-11-23 ENCOUNTER — Encounter: Payer: Self-pay | Admitting: Medical

## 2012-11-23 VITALS — BP 130/90 | HR 68 | Temp 98.4°F | Resp 16 | Wt 189.0 lb

## 2012-11-23 DIAGNOSIS — E663 Overweight: Secondary | ICD-10-CM

## 2012-11-23 MED ORDER — PHENTERMINE HCL 37.5 MG PO CAPS: 37.5000 mg | ORAL_CAPSULE | ORAL | Status: DC

## 2012-11-23 NOTE — Progress Notes (Signed)
Subjective: Here for weight concerns.   She request some assistance with weight loss.  She notes being 200lb in October.  By November 196lb.  Has been trying to lose weight.   Walking 12-13 miles per week per pedometer.  Getting exercise.  Cardio exercise includes running, DVDs, leg lunges, planks, kick boxing.  Trying to eat healthy.  In the past saw a dietician.   For breakfast eats toasted english muffin and strawberries.  Salad typically at lunch, parfait.  Had stopped eating meat for a while.  But just started back fish and chicken.  Dinner last night boiled eggs, snap peas.  She is not sure the amount of calories per day.  Has been on phentermine prior.  This helped, and would like to try this again.  Has tried weight watchers program prior, but too expensive to do now.  Past Medical History  Diagnosis Date  . Anemia   . Hypertension   . Unspecified vitamin D deficiency   . Uterine leiomyoma   . Astigmatism   . Incompetent cervix    Family History  Problem Relation Age of Onset  . COPD Mother   . Seizures Mother   . Heart disease Father     died of cardiac arrest  . Other Father     bowel infection  . Cancer Sister     lung  . Cancer Paternal Grandfather     lung  . Diabetes Neg Hx   . Cancer Sister     lung    ROS Unremarkable   Objective: Filed Vitals:   11/23/12 1511  BP: 130/90  Pulse: 68  Temp: 98.4 F (36.9 C)  Resp: 16    General appearance: alert, no distress, WD/WN Neck: supple, no lymphadenopathy, no thyromegaly, no masses Heart: RRR, normal S1, S2, no murmurs Lungs: CTA bilaterally, no wheezes, rhonchi, or rales Pulses: 2+ symmetric, upper and lower extremities, normal cap refill Ext: no edema  Assessment: Encounter Diagnosis  Name Primary?  Marland Kitchen Overweight Yes    Plan: 1800 cal/day diet, discussed healthy diet choices, c/t current exercise, begin Phentermine, discussed risks/benefits of medication, f/u 26mo.

## 2012-11-23 NOTE — Patient Instructions (Signed)
I recommend exercising most days of the week using a type of exercise that they would enjoy and stick to such as walking, running, swimming, hiking, biking, aerobics, etc.  I recommend for now that you aim for 1800 calories per day.   Consider using My Fitness Pal or Livestrong App.    I recommend a healthy diet.    Do's:   whole grains such as whole grain pasta, rice, whole grains breads and whole grain cereals.  Use small quantities such as 1/2 cup per serving or 2 slices of bread per serving.    Eat 3-5 fruits daily  Eat beans at least once daily  Eat almonds in small quantities at least 3 days per week    If they eat meat, I recommend small portions of lean meats such as chicken, fish, and [place].  Eat as much NON corn and NON potato vegetables as they like, particularly raw or steamed  Drink several large glasses of water daily  Cautions:  Limit red meat  Limit corn and potatoes  Limit sweets, cake, pie, candy  Limit beer and alcohol  Avoid fried food, fast food, large portions  Avoid sugary drinks such as regular soda and sweet tea  Don't skip meals, but rather eat smaller ffrequentportions to boost metabolism.

## 2013-01-13 ENCOUNTER — Encounter: Payer: Self-pay | Admitting: Internal Medicine

## 2013-01-27 ENCOUNTER — Encounter: Payer: Self-pay | Admitting: Family Medicine

## 2013-01-27 ENCOUNTER — Ambulatory Visit (INDEPENDENT_AMBULATORY_CARE_PROVIDER_SITE_OTHER): Payer: No Typology Code available for payment source | Admitting: Family Medicine

## 2013-01-27 VITALS — BP 130/86 | HR 84 | Ht 67.0 in | Wt 189.0 lb

## 2013-01-27 DIAGNOSIS — Z13220 Encounter for screening for lipoid disorders: Secondary | ICD-10-CM

## 2013-01-27 DIAGNOSIS — R5381 Other malaise: Secondary | ICD-10-CM

## 2013-01-27 DIAGNOSIS — Z0000 Encounter for general adult medical examination without abnormal findings: Secondary | ICD-10-CM

## 2013-01-27 DIAGNOSIS — D649 Anemia, unspecified: Secondary | ICD-10-CM

## 2013-01-27 DIAGNOSIS — D259 Leiomyoma of uterus, unspecified: Secondary | ICD-10-CM

## 2013-01-27 DIAGNOSIS — E559 Vitamin D deficiency, unspecified: Secondary | ICD-10-CM | POA: Insufficient documentation

## 2013-01-27 DIAGNOSIS — R030 Elevated blood-pressure reading, without diagnosis of hypertension: Secondary | ICD-10-CM

## 2013-01-27 LAB — POCT URINALYSIS DIPSTICK
Bilirubin, UA: NEGATIVE
Blood, UA: NEGATIVE
Ketones, UA: NEGATIVE
pH, UA: 7

## 2013-01-27 NOTE — Patient Instructions (Addendum)
HEALTH MAINTENANCE RECOMMENDATIONS:  It is recommended that you get at least 30 minutes of aerobic exercise at least 5 days/week (for weight loss, you may need as much as 60-90 minutes). This can be any activity that gets your heart rate up. This can be divided in 10-15 minute intervals if needed, but try and build up your endurance at least once a week.  Weight bearing exercise is also recommended twice weekly.  Eat a healthy diet with lots of vegetables, fruits and fiber.  "Colorful" foods have a lot of vitamins (ie green vegetables, tomatoes, red peppers, etc).  Limit sweet tea, regular sodas and alcoholic beverages, all of which has a lot of calories and sugar.  Up to 1 alcoholic drink daily may be beneficial for women (unless trying to lose weight, watch sugars).  Drink a lot of water.  Calcium recommendations are 1200-1500 mg daily (1500 mg for postmenopausal women or women without ovaries), and vitamin D 1000 IU daily.  This should be obtained from diet and/or supplements (vitamins), and calcium should not be taken all at once, but in divided doses.  Monthly self breast exams and yearly mammograms for women over the age of 65 is recommended.  Sunscreen of at least SPF 30 should be used on all sun-exposed parts of the skin when outside between the hours of 10 am and 4 pm (not just when at beach or pool, but even with exercise, golf, tennis, and yard work!)  Use a sunscreen that says "broad spectrum" so it covers both UVA and UVB rays, and make sure to reapply every 1-2 hours.  Remember to change the batteries in your smoke detectors when changing your clock times in the spring and fall.  Use your seat belt every time you are in a car, and please drive safely and not be distracted with cell phones and texting while driving.  We will be referring you to a GYN for your breast/pelvic exam and discussion of your heavy bleeding. Try and increase exercise to at least 30-45 min daily.  Try and check  your blood pressure once a week and write down--bring your paper with BP's to your next visit.  If your BP remains elevated, we may need to restart BP medication.  Sodium-Controlled Diet Sodium is a mineral. It is found in many foods. Sodium may be found naturally or added during the making of a food. The most common form of sodium is salt, which is made up of sodium and chloride. Reducing your sodium intake involves changing your eating habits. The following guidelines will help you reduce the sodium in your diet:  Stop using the salt shaker.  Use salt sparingly in cooking and baking.  Substitute with sodium-free seasonings and spices.  Do not use a salt substitute (potassium chloride) without your caregiver's permission.  Include a variety of fresh, unprocessed foods in your diet.  Limit the use of processed and convenience foods that are high in sodium. USE THE FOLLOWING FOODS SPARINGLY: Breads/Starches  Commercial bread stuffing, commercial pancake or waffle mixes, coating mixes. Waffles. Croutons. Prepared (boxed or frozen) potato, rice, or noodle mixes that contain salt or sodium. Salted [place] fries or hash browns. Salted popcorn, breads, crackers, chips, or snack foods. Vegetables  Vegetables canned with salt or prepared in cream, butter, or cheese sauces. Sauerkraut. Tomato or vegetable juices canned with salt.  Fresh vegetables are allowed if rinsed thoroughly. Fruit  Fruit is okay to eat. Meat and Meat Substitutes  Salted or smoked  meats, such as bacon or Canadian bacon, chipped or corned beef, hot dogs, salt pork, luncheon meats, pastrami, ham, or sausage. Canned or smoked fish, poultry, or meat. Processed cheese or cheese spreads, blue or [place] cheese. Battered or frozen fish products. Prepared spaghetti sauce. Baked beans. Reuben sandwiches. Salted nuts. Caviar. Milk  Limit buttermilk to 1 cup per week. Soups and Combination Foods  Bouillon cubes, canned or  dried soups, broth, consomm. Convenience (frozen or packaged) dinners with more than 600 mg sodium. Pot pies, pizza, Asian food, fast food cheeseburgers, and specialty sandwiches. Desserts and Sweets  Regular (salted) desserts, pie, commercial fruit snack pies, commercial snack cakes, canned puddings.  Eat desserts and sweets in moderation. Fats and Oils  Gravy mixes or canned gravy. No more than 1 to 2 tbs of salad dressing. Chip dips.  Eat fats and oils in moderation. Beverages  See those listed under the vegetables and milk groups. Condiments  Ketchup, mustard, meat sauces, salsa, regular (salted) and lite soy sauce or mustard. Dill pickles, olives, meat tenderizer. Prepared horseradish or pickle relish. Dutch-processed cocoa. Baking powder or baking soda used medicinally. Worcestershire sauce. "Light" salt. Salt substitute, unless approved by your caregiver. Document Released: 03/08/2002 Document Revised: 12/09/2011 Document Reviewed: 10/09/2009 [facility] Patient Information 2013 [place], [place].

## 2013-01-27 NOTE — Progress Notes (Signed)
Chief Complaint  Patient presents with  . Annual Exam    nonfasting annual exam, wants to wait on pap-is still on her cycle. States that she has been urinating a lot. Was also seeing Vincenza Hews for weightloss. Eczema on her scalp. Would llike her chol and iron checked, but is not fasting. Did not vision today as she just had exam-got new contacts.   Kaitlin Shannon is a 32 y.o. female who presents for a complete physical.  She has the following concerns:  On period now.  Periods are monthly.  Feels fatigued when she is on her cycle, bleeds heavily.  H/o anemia and fibroids.  She declines having pelvic exam today. Takes 1 OTC iron daily.  occasionally feels dizzy with her periods.  She would like to f/u on her weight loss. She saw Vincenza Hews 2 months ago, was prescribed phentermine.  She was supposed to follow up with him in a month and never did.  She reports that she only took the med for about 2 weeks, as she didn't take it once she was on her period, was feeling tired and not exercising (thought she should only take when exercising).  She has a h/o HTN, previously treated with HCTZ, not recently on meds.  Health Maintenance: There is no immunization history on file for this patient. Per paper chart, last tetanus was in 2009 (per Dr. Henriette Combs note from 2011). Got flu shot last year Last Pap smear: 2009 Last mammogram: distant past Last colonoscopy: never Last DEXA: never Dentist: last year Ophtho: 2 months ago Exercise:  3x/week, 20-30 minutes of cardio + weights.  Getting 10,000 steps/day  Past Medical History  Diagnosis Date  . Anemia   . Hypertension   . Unspecified vitamin D deficiency   . Uterine leiomyoma   . Astigmatism   . Incompetent cervix     Past Surgical History  Procedure Laterality Date  . Tubal ligation      History   Social History  . Marital Status: Married    Spouse Name: N/A    Number of Children: 3  . Years of Education: N/A   Occupational History  .  courrier    Social History Main Topics  . Smoking status: Never Smoker   . Smokeless tobacco: Never Used  . Alcohol Use: Yes     Comment: once or twice a year  . Drug Use: No  . Sexually Active: Yes -- Female partner(s)    Birth Control/ Protection: Surgical   Other Topics Concern  . Not on file   Social History Narrative   Lives with husband, 3 kids    Family History  Problem Relation Age of Onset  . COPD Mother   . Seizures Mother   . Heart disease Father     died of cardiac arrest  . Other Father     bowel infection  . Cancer Sister     lung  . Cancer Paternal Grandfather     lung  . Diabetes Neg Hx   . Colon cancer Neg Hx   . Cancer Sister     lung  . Cancer Maternal Grandmother     breast cancer  . Breast cancer Maternal Grandmother     Current outpatient prescriptions:ferrous sulfate 325 (65 FE) MG tablet, Take 1 tablet (325 mg total) by mouth daily., Disp: 30 tablet, Rfl: 0;  acetaminophen (TYLENOL) 500 MG tablet, Take 500 mg by mouth every 6 (six) hours as needed. For pain, Disp: ,  Rfl: ;  phentermine 37.5 MG capsule, Take 1 capsule (37.5 mg total) by mouth every morning., Disp: 30 capsule, Rfl: 0 (not currently taking the phentermine)  No Known Allergies  ROS:  The patient denies anorexia, fever, headaches,  vision changes, decreased hearing, ear pain, sore throat, breast concerns, chest pain, palpitations, syncope, dyspnea on exertion, cough, swelling, nausea, vomiting, diarrhea, constipation, abdominal pain, melena, hematochezia, indigestion/heartburn, hematuria, incontinence, dysuria, irregular menstrual cycles--+heavy; no vaginal discharge, odor or itch, genital lesions, joint pains, numbness, tingling, weakness, tremor, suspicious skin lesions, depression, anxiety, abnormal bleeding/bruising, or enlarged lymph nodes. +fatigue. No weight change (but wearing boots and a LOT of jewelry for today's weight, declined their removal).  PHYSICAL EXAM: BP 130/86   Pulse 84  Ht 5\' 7"  (1.702 m)  Wt 189 lb (85.73 kg)  BMI 29.59 kg/m2  LMP 01/21/2013  General Appearance:    Alert, cooperative, no distress, appears stated age; wearing excessive amount of costume jewelry (and her boots--declined to remove any of this for her weight today).  Head:    Normocephalic, without obvious abnormality, atraumatic  Eyes:    PERRL, conjunctiva/corneas clear, EOM's intact, fundi    benign  Ears:    Normal TM's and external ear canals  Nose:   Nares normal, mucosa normal, no drainage or sinus   tenderness  Throat:   Lips, mucosa, and tongue normal; teeth and gums normal  Neck:   Supple, no lymphadenopathy;  thyroid:  no   enlargement/tenderness/nodules; no carotid   bruit or JVD  Back:    Spine nontender, no curvature, ROM normal, no CVA     tenderness  Lungs:     Clear to auscultation bilaterally without wheezes, rales or     ronchi; respirations unlabored  Chest Wall:    No tenderness or deformity   Heart:    Regular rate and rhythm, S1 and S2 normal, no murmur, rub   or gallop  Breast Exam:    Deferred to GYN  Abdomen:     Soft, non-tender, nondistended, normoactive bowel sounds,    no masses, no hepatosplenomegaly.  Perhaps slight fullness in lower abdomen, nontender  Genitalia:    Deferred to GYN     Extremities:   No clubbing, cyanosis or edema.  Feet not examined (declined removing her boots), she denies any swelling  Pulses:   2+ and symmetric upper extremities  Skin:   Skin color, texture, turgor normal, no rashes or lesions  Lymph nodes:   Cervical, supraclavicular, and axillary nodes normal  Neurologic:   CNII-XII intact, normal strength, sensation and gait; reflexes 2+ and symmetric throughout          Psych:   Normal mood, affect, hygiene and grooming.    Wearing jeans and boots (declined to take off). Wearing LOTS of costume jewelry (heavy), and was wearing her boots when weighed.  ASSESSMENT/PLAN:   Increase exercise to at least 30 mins of  cardio daily, up to 45-60 mins for faster weight loss.  Encouraged to continue healthy diet. Unclear whether or not she had any weight loss since last visit, as todays weight may not have been very accurate due to her clothing/jewelry (not sure what she was wearing 2 months ago).  Elevated BP, caution with phentermine--prefer not to use if able to lose without it.  If BP's remain elevated, may need to resume HCTZ. Return in 2-4 weeks for f/u on weight and blood pressure (rx not addressed or refilled today.)  Her BMI is  less than 30, so technically, without other comorbidities (?her BP) she isn't a candidate for this medication.  Refer to GYN for routine GYN care with heavy periods, fibroids.  Seeks treatment for heavy cycles.  Labs pending , h/o anemia  Return in 2-4 weeks for f/u on weight and BP

## 2013-02-03 ENCOUNTER — Other Ambulatory Visit: Payer: Self-pay | Admitting: Family Medicine

## 2013-02-03 ENCOUNTER — Other Ambulatory Visit: Payer: No Typology Code available for payment source

## 2013-02-03 DIAGNOSIS — R5383 Other fatigue: Secondary | ICD-10-CM

## 2013-02-03 DIAGNOSIS — R5381 Other malaise: Secondary | ICD-10-CM

## 2013-02-03 DIAGNOSIS — Z13220 Encounter for screening for lipoid disorders: Secondary | ICD-10-CM

## 2013-02-03 DIAGNOSIS — D649 Anemia, unspecified: Secondary | ICD-10-CM

## 2013-02-03 LAB — LIPID PANEL
LDL Cholesterol: 69 mg/dL (ref 0–99)
VLDL: 9 mg/dL (ref 0–40)

## 2013-02-03 LAB — CBC WITH DIFFERENTIAL/PLATELET
Basophils Relative: 1 % (ref 0–1)
Eosinophils Absolute: 0.1 10*3/uL (ref 0.0–0.7)
HCT: 29.3 % — ABNORMAL LOW (ref 36.0–46.0)
Hemoglobin: 8.2 g/dL — ABNORMAL LOW (ref 12.0–15.0)
MCH: 19 pg — ABNORMAL LOW (ref 26.0–34.0)
MCHC: 28 g/dL — ABNORMAL LOW (ref 30.0–36.0)
MCV: 67.8 fL — ABNORMAL LOW (ref 78.0–100.0)
Monocytes Absolute: 0.5 10*3/uL (ref 0.1–1.0)
Monocytes Relative: 16 % — ABNORMAL HIGH (ref 3–12)

## 2013-02-03 LAB — COMPREHENSIVE METABOLIC PANEL
ALT: 10 U/L (ref 0–35)
AST: 24 U/L (ref 0–37)
Albumin: 3.9 g/dL (ref 3.5–5.2)
Alkaline Phosphatase: 24 U/L — ABNORMAL LOW (ref 39–117)
Calcium: 9 mg/dL (ref 8.4–10.5)
Chloride: 103 mEq/L (ref 96–112)
Creat: 0.79 mg/dL (ref 0.50–1.10)
Potassium: 4.2 mEq/L (ref 3.5–5.3)

## 2013-02-04 ENCOUNTER — Encounter: Payer: Self-pay | Admitting: Family Medicine

## 2013-02-04 DIAGNOSIS — D509 Iron deficiency anemia, unspecified: Secondary | ICD-10-CM | POA: Insufficient documentation

## 2013-02-04 LAB — FERRITIN: Ferritin: <1 ng/mL — ABNORMAL LOW (ref 10–291)

## 2013-02-10 ENCOUNTER — Ambulatory Visit (INDEPENDENT_AMBULATORY_CARE_PROVIDER_SITE_OTHER): Payer: No Typology Code available for payment source | Admitting: Family Medicine

## 2013-02-10 ENCOUNTER — Encounter: Payer: Self-pay | Admitting: Family Medicine

## 2013-02-10 VITALS — BP 124/78 | HR 64 | Ht 67.0 in | Wt 183.0 lb

## 2013-02-10 DIAGNOSIS — E559 Vitamin D deficiency, unspecified: Secondary | ICD-10-CM

## 2013-02-10 DIAGNOSIS — E669 Obesity, unspecified: Secondary | ICD-10-CM

## 2013-02-10 DIAGNOSIS — D509 Iron deficiency anemia, unspecified: Secondary | ICD-10-CM

## 2013-02-10 MED ORDER — VITAMIN D (ERGOCALCIFEROL) 1.25 MG (50000 UNIT) PO CAPS: 50000.0000 [IU] | ORAL_CAPSULE | ORAL | Status: DC

## 2013-02-10 MED ORDER — INTEGRA PLUS PO CAPS: 1.0000 | ORAL_CAPSULE | Freq: Every day | ORAL | Status: DC

## 2013-02-10 NOTE — Patient Instructions (Addendum)
Vitamin D deficiency--start taking the prescription once weekly.  This will be for 3 months.  Once you finish the last prescription pill, start taking an over-the-counter Vitamin D3 1000 IU every day, and you will need to continue this vitamin longterm.  Iron deficiency anemia--this is due to your heavy periods.  We need to get you to a gynecologist to discuss your options for treatment.  In the meantime, we need to adequately replace your iron.  Start the [employer] once daily.  Try and eat high iron foods.  Return in 6-8 weeks for repeat testing of iron  Continue healthy eating, regular exercise, portion control.  You may use the rest of the phentermine you have left, if you need to, to help with appetite--you might want to save them for more challenging days (travel, parties)

## 2013-02-10 NOTE — Progress Notes (Signed)
Chief Complaint  Patient presents with  . Advice Only    weight consult and follow up on bp.    She took phentermine for 2 weeks back the end of February.  She stopped it when she was on her period--didn't feel good, wasn't exercising, so didn't bother taking the medication.  She never went back on it (was concerned about restarting after being off it, as per what label said).  She didn't have any side effects, noticed it was helpful in decreasing her appetite, ate less when taking it.  She has continued to eat healthy, and get regular exercise.  She has lost 5 pounds since her last visit (and has NOT taken any phentermine during this interval), and clothes are fitting better.    Her lowest weight was 175; goal weight is 160.  She is waiting for list in mail of which OB-GYN's are on her insurance plan, so we can make a recommendation.  She is being referred for evaluation of fibroids, heavy periods and iron deficiency anemia, for more definitive treatment.  Past Medical History  Diagnosis Date  . Anemia   . Hypertension   . Unspecified vitamin D deficiency   . Uterine leiomyoma   . Astigmatism   . Incompetent cervix    Past Surgical History  Procedure Laterality Date  . Tubal ligation     History   Social History  . Marital Status: Married    Spouse Name: N/A    Number of Children: 3  . Years of Education: N/A   Occupational History  . courrier    Social History Main Topics  . Smoking status: Never Smoker   . Smokeless tobacco: Never Used  . Alcohol Use: Yes     Comment: once or twice a year  . Drug Use: No  . Sexually Active: Yes -- Female partner(s)    Birth Control/ Protection: Surgical   Other Topics Concern  . Not on file   Social History Narrative   Lives with husband, 3 kids   Current Outpatient Prescriptions on File Prior to Visit  Medication Sig Dispense Refill  . ferrous sulfate 325 (65 FE) MG tablet Take 1 tablet (325 mg total) by mouth daily.  30 tablet   0  . acetaminophen (TYLENOL) 500 MG tablet Take 500 mg by mouth every 6 (six) hours as needed. For pain      . phentermine 37.5 MG capsule Take 1 capsule (37.5 mg total) by mouth every morning.  30 capsule  0   No current facility-administered medications on file prior to visit.   No Known Allergies  ROS:  +fatigue.  Rare dizziness.  No chest pain, shortness of breath.  Mild constipation from the iron.  Denies vomiting, diarrhea, blood in stools.  Denies urinary complaints, joint pains, URI symptoms, fevers, or other complaints.  PHYSICAL EXAM: BP 124/78  Pulse 64  Ht 5\' 7"  (1.702 m)  Wt 183 lb (83.008 kg)  BMI 28.66 kg/m2  PF 183 L/min  LMP 01/21/2013 Pleasant female in no distress Exam was limited to discussion/counseling  Lab Results  Component Value Date   WBC 3.0* 02/03/2013   HGB 8.2* 02/03/2013   HCT 29.3* 02/03/2013   MCV 67.8* 02/03/2013   PLT 457* 02/03/2013   Lab Results  Component Value Date   IRON 15* 02/03/2013   FERRITIN <1* 02/03/2013   Vitamin D-OH 18    Chemistry      Component Value Date/Time   NA 136 02/03/2013  0819   K 4.2 02/03/2013 0819   CL 103 02/03/2013 0819   CO2 28 02/03/2013 0819   BUN 9 02/03/2013 0819   CREATININE 0.79 02/03/2013 0819   CREATININE 0.68 11/21/2011 2142      Component Value Date/Time   CALCIUM 9.0 02/03/2013 0819   ALKPHOS 24* 02/03/2013 0819   AST 24 02/03/2013 0819   ALT 10 02/03/2013 0819   BILITOT 0.7 02/03/2013 0819     Lab Results  Component Value Date   CHOL 130 02/03/2013   HDL 52 02/03/2013   LDLCALC 69 02/03/2013   TRIG 44 02/03/2013   CHOLHDL 2.5 02/03/2013   ASSESSMENT/PLAN:  Anemia, iron deficiency - Plan: FeFum-FePoly-FA-B Cmp-C-Biot (INTEGRA PLUS) CAPS, CBC with Differential, Ferritin, Iron  Unspecified vitamin D deficiency - Plan: Vitamin D, Ergocalciferol, (DRISDOL) 50000 UNITS CAPS  OBESITY  Counseled re: healthy diet, exercise, portion control, healthy snacks.  She doesn't meet criteria for ongoing use of phentermine (as  BMI  is already <30 with no additional risk factors), but she may finish up what she has left at home--I recommended using on more challenging days (parties, etc).  Reassured that she has been losing weight on her own, and that if she keeps it up, she should continue to lose. Reviewed goal weights and expectations.  Vitamin D deficiency--reviewed risks of deficiency without replacement, and to take the rx x 12 weeks, and change over to 1000 IU over-the-counter after 3 months of prescription therapy.  Iron deficiency--barely tolerating the once daily OTC iron, so recommended trial of Integra Plus once daily.  Refer to GYN--await the list she will provide, in order to give a recommendation as she requests.  High iron foods recommended.    F/u 3 months for OV, f/u weight and vitamin D nonfasting lab visit in 6-8 weeks for CBC and ferritin  30 minute visit, all counseling.

## 2013-03-17 ENCOUNTER — Telehealth: Payer: Self-pay | Admitting: Family Medicine

## 2013-03-17 NOTE — Telephone Encounter (Signed)
Patient called me this morning to let me know that she never heard from [facility] that she was referred over for. I looked in EPIC and she is scheduled with them for next Monday and she was unaware of this. She is calling them now to confirm. In the meantime she already scheduled an appointment with you for tomorrow for heavy bleeding and cramps, I told her that you might just want her to wait until her appointment with GYN on Monday as this what she was originally referred for. Having no other symptoms except for the ones I just stated. Please let me know if she should just wait until Monday and I will call her back and cancel appointment, thanks.

## 2013-03-17 NOTE — Telephone Encounter (Signed)
Returned patient's call and left message for her to call me back.

## 2013-03-17 NOTE — Telephone Encounter (Signed)
Just wait for GYN appt.  I don't need to see her until Aug/September

## 2013-03-18 ENCOUNTER — Ambulatory Visit: Payer: No Typology Code available for payment source | Admitting: Family Medicine

## 2013-03-22 ENCOUNTER — Ambulatory Visit (INDEPENDENT_AMBULATORY_CARE_PROVIDER_SITE_OTHER): Payer: No Typology Code available for payment source | Admitting: Obstetrics & Gynecology

## 2013-03-22 ENCOUNTER — Encounter: Payer: Self-pay | Admitting: Obstetrics & Gynecology

## 2013-03-22 VITALS — BP 127/78 | HR 62 | Temp 98.2°F | Ht 69.0 in | Wt 184.5 lb

## 2013-03-22 DIAGNOSIS — Z0000 Encounter for general adult medical examination without abnormal findings: Secondary | ICD-10-CM

## 2013-03-22 DIAGNOSIS — Z01419 Encounter for gynecological examination (general) (routine) without abnormal findings: Secondary | ICD-10-CM

## 2013-03-22 DIAGNOSIS — N920 Excessive and frequent menstruation with regular cycle: Secondary | ICD-10-CM

## 2013-03-22 NOTE — Progress Notes (Signed)
Subjective:    Kaitlin Shannon is a 32 y.o. female who presents for an annual exam. She complains of very heavy periods that come monthly and last for 7-10 days. She has to double up on overnight pads during her period. Her HBG as 8.2 recently. The patient is sexually active. GYN screening history: last pap: was normal. The patient wears seatbelts: yes. The patient participates in regular exercise: yes. Has the patient ever been transfused or tattooed?: yes (tattoos). The patient reports that there is not domestic violence in her life.   Menstrual History: OB History   Grav Para Term Preterm Abortions TAB SAB Ect Mult Living   5 3 1 2 2  2   3       Menarche age: 10  Patient's last menstrual period was 02/20/2013.    The following portions of the patient's history were reviewed and updated as appropriate: allergies, current medications, past family history, past medical history, past social history, past surgical history and problem list.  Review of Systems A comprehensive review of systems was negative.  Married for 10 years, denies dyspareunia. 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)   Objective:    BP 127/78  Pulse 62  Temp(Src) 98.2 F (36.8 C) (Oral)  Ht 5\' 9"  (1.753 m)  Wt 184 lb 8 oz (83.689 kg)  BMI 27.23 kg/m2  LMP 02/20/2013  General Appearance:    Alert, cooperative, no distress, appears stated age  Head:    Normocephalic, without obvious abnormality, atraumatic  Eyes:    PERRL, conjunctiva/corneas clear, EOM's intact, fundi    benign, both eyes  Ears:    Normal TM's and external ear canals, both ears  Nose:   Nares normal, septum midline, mucosa normal, no drainage    or sinus tenderness  Throat:   Lips, mucosa, and tongue normal; teeth and gums normal  Neck:   Supple, symmetrical, trachea midline, no adenopathy;    thyroid:  no enlargement/tenderness/nodules; no carotid   bruit or JVD  Back:     Symmetric, no curvature, ROM normal, no CVA tenderness  Lungs:     Clear to  auscultation bilaterally, respirations unlabored  Chest Wall:    No tenderness or deformity   Heart:    Regular rate and rhythm, S1 and S2 normal, no murmur, rub   or gallop  Breast Exam:    No tenderness, masses, or nipple abnormality  Abdomen:     Soft, non-tender, bowel sounds active all four quadrants,    no masses, no organomegaly, uterus palpable at U-3  Genitalia:    Normal female without lesion, discharge or tenderness, pap obtained, 16 week size mobile uterus, no adnexal masses palpated     Extremities:   Extremities normal, atraumatic, no cyanosis or edema  Pulses:   2+ and symmetric all extremities  Skin:   Skin color, texture, turgor normal, no rashes or lesions  Lymph nodes:   Cervical, supraclavicular, and axillary nodes normal  Neurologic:   CNII-XII intact, normal strength, sensation and reflexes    throughout  .    Assessment:    Healthy female exam.  Fibroids, menorrhagia, anemia   Plan:     Pelvic ultrasound. Thin prep Pap smear.  I have discussed treatment options for her symptomatic fibroids, including hormonal treatment, [place], and hysterectomy. She opts for a hysterectomy. I will send [place] an email to schedule this.

## 2013-03-29 ENCOUNTER — Ambulatory Visit (HOSPITAL_COMMUNITY)
Admission: RE | Admit: 2013-03-29 | Discharge: 2013-03-29 | Disposition: A | Discharge status: Home / Self Care | Payer: No Typology Code available for payment source | Source: Ambulatory Visit | Attending: Obstetrics & Gynecology | Admitting: Obstetrics & Gynecology

## 2013-03-29 DIAGNOSIS — Z0000 Encounter for general adult medical examination without abnormal findings: Secondary | ICD-10-CM

## 2013-03-29 DIAGNOSIS — D250 Submucous leiomyoma of uterus: Secondary | ICD-10-CM | POA: Insufficient documentation

## 2013-03-29 DIAGNOSIS — N920 Excessive and frequent menstruation with regular cycle: Secondary | ICD-10-CM | POA: Insufficient documentation

## 2013-03-29 DIAGNOSIS — D251 Intramural leiomyoma of uterus: Secondary | ICD-10-CM | POA: Insufficient documentation

## 2013-03-31 ENCOUNTER — Other Ambulatory Visit: Payer: No Typology Code available for payment source

## 2013-03-31 DIAGNOSIS — D509 Iron deficiency anemia, unspecified: Secondary | ICD-10-CM

## 2013-03-31 LAB — FERRITIN: Ferritin: 3 ng/mL — ABNORMAL LOW (ref 10–291)

## 2013-03-31 LAB — IRON: Iron: 254 ug/dL — ABNORMAL HIGH (ref 42–145)

## 2013-03-31 LAB — CBC WITH DIFFERENTIAL/PLATELET
Eosinophils Absolute: 0.1 10*3/uL (ref 0.0–0.7)
Hemoglobin: 7.8 g/dL — ABNORMAL LOW (ref 12.0–15.0)
Lymphocytes Relative: 42 % (ref 12–46)
Lymphs Abs: 1.5 10*3/uL (ref 0.7–4.0)
MCH: 18.8 pg — ABNORMAL LOW (ref 26.0–34.0)
Monocytes Relative: 15 % — ABNORMAL HIGH (ref 3–12)
Neutro Abs: 1.4 10*3/uL — ABNORMAL LOW (ref 1.7–7.7)
Neutrophils Relative %: 38 % — ABNORMAL LOW (ref 43–77)
RBC: 4.15 MIL/uL (ref 3.87–5.11)
WBC: 3.7 10*3/uL — ABNORMAL LOW (ref 4.0–10.5)

## 2013-04-13 ENCOUNTER — Encounter: Payer: Self-pay | Admitting: Obstetrics & Gynecology

## 2013-04-21 ENCOUNTER — Ambulatory Visit: Payer: Self-pay | Admitting: Obstetrics & Gynecology

## 2013-05-10 ENCOUNTER — Encounter: Payer: Self-pay | Admitting: Obstetrics & Gynecology

## 2013-05-10 ENCOUNTER — Ambulatory Visit (INDEPENDENT_AMBULATORY_CARE_PROVIDER_SITE_OTHER): Payer: PRIVATE HEALTH INSURANCE | Admitting: Obstetrics & Gynecology

## 2013-05-10 VITALS — BP 127/76 | HR 69 | Temp 97.0°F | Ht 69.0 in | Wt 184.0 lb

## 2013-05-10 DIAGNOSIS — D259 Leiomyoma of uterus, unspecified: Secondary | ICD-10-CM

## 2013-05-10 DIAGNOSIS — D219 Benign neoplasm of connective and other soft tissue, unspecified: Secondary | ICD-10-CM

## 2013-05-10 DIAGNOSIS — N920 Excessive and frequent menstruation with regular cycle: Secondary | ICD-10-CM

## 2013-05-10 NOTE — Progress Notes (Signed)
  Subjective:    Patient ID: Kaitlin Shannon, female    DOB: 1980-10-31, 32 y.o.   MRN: [id number]  HPI  32 yo MP3 (67,7,32 yo) here today for her pre op visit. She has severe anemia 7.8 hbg last month and dysmenorrhea. Her uterus measures 17 cm on u/s with fibroids. She has chronic constipation (2-3 BMs per week).  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    Objective:   Physical Exam        Assessment & Plan:  Symptomatic fibroids- Plan for TAH, bilateral salpingectomy. Because of her chronic constipation and large uterus, I am asking her to do a bowel prep the day before surgery with Miralax. Because her hemoglobin is so low, I will plan on giving her 3 units of PRBCs.  She understands the risks of surgery, including, but not to infection, bleeding, DVTs, damage to bowel, bladder, ureters. She wishes to proceed.

## 2013-05-13 ENCOUNTER — Encounter (HOSPITAL_COMMUNITY): Payer: Self-pay | Admitting: Pharmacy Technician

## 2013-05-19 ENCOUNTER — Encounter (HOSPITAL_COMMUNITY)
Admission: RE | Admit: 2013-05-19 | Discharge: 2013-05-19 | Disposition: A | Discharge status: Home / Self Care | Payer: 59 | Source: Ambulatory Visit | Attending: Obstetrics & Gynecology | Admitting: Obstetrics & Gynecology

## 2013-05-19 ENCOUNTER — Encounter (HOSPITAL_COMMUNITY): Payer: Self-pay

## 2013-05-19 DIAGNOSIS — Z01818 Encounter for other preprocedural examination: Secondary | ICD-10-CM | POA: Insufficient documentation

## 2013-05-19 DIAGNOSIS — Z01812 Encounter for preprocedural laboratory examination: Secondary | ICD-10-CM | POA: Insufficient documentation

## 2013-05-19 LAB — CBC
Hemoglobin: 8.8 g/dL — ABNORMAL LOW (ref 12.0–15.0)
Platelets: 280 10*3/uL (ref 150–400)
RBC: 4.38 MIL/uL (ref 3.87–5.11)
WBC: 3.3 10*3/uL — ABNORMAL LOW (ref 4.0–10.5)

## 2013-05-19 NOTE — Patient Instructions (Signed)
Your procedure is scheduled on: 05/25/13  Enter through the Main Entrance at :1200 pm Pick up desk phone and dial [phone] and inform us of your arrival.  Please call [phone] if you have any problems the morning of surgery.  Remember: Do not eat food after midnight:Monday Clear liquids are ok until:9am Tuesday   You may brush your teeth the morning of surgery.    DO NOT wear jewelry, eye make-up, lipstick,body lotion, or dark fingernail polish.  (Polished toes are ok) You may wear deodorant.  If you are to be admitted after surgery, leave suitcase in car until your room has been assigned. Patients discharged on the day of surgery will not be allowed to drive home. Wear loose fitting, comfortable clothes for your ride home.

## 2013-05-25 ENCOUNTER — Encounter (HOSPITAL_COMMUNITY): Payer: Self-pay | Admitting: Anesthesiology

## 2013-05-25 ENCOUNTER — Inpatient Hospital Stay (HOSPITAL_COMMUNITY)
Admission: RE | Admit: 2013-05-25 | Discharge: 2013-05-27 | DRG: 743 | Disposition: A | Discharge status: Home / Self Care | Payer: 59 | Source: Ambulatory Visit | Attending: Obstetrics & Gynecology | Admitting: Obstetrics & Gynecology

## 2013-05-25 ENCOUNTER — Encounter (HOSPITAL_COMMUNITY)
Admission: RE | Disposition: A | Discharge status: Home / Self Care | Payer: Self-pay | Source: Ambulatory Visit | Attending: Obstetrics & Gynecology

## 2013-05-25 ENCOUNTER — Inpatient Hospital Stay (HOSPITAL_COMMUNITY): Payer: 59 | Admitting: Anesthesiology

## 2013-05-25 ENCOUNTER — Encounter (HOSPITAL_COMMUNITY): Payer: Self-pay | Admitting: *Deleted

## 2013-05-25 DIAGNOSIS — D259 Leiomyoma of uterus, unspecified: Secondary | ICD-10-CM

## 2013-05-25 DIAGNOSIS — I10 Essential (primary) hypertension: Secondary | ICD-10-CM | POA: Diagnosis present

## 2013-05-25 DIAGNOSIS — D509 Iron deficiency anemia, unspecified: Secondary | ICD-10-CM

## 2013-05-25 DIAGNOSIS — D500 Iron deficiency anemia secondary to blood loss (chronic): Secondary | ICD-10-CM | POA: Diagnosis present

## 2013-05-25 DIAGNOSIS — E559 Vitamin D deficiency, unspecified: Secondary | ICD-10-CM | POA: Diagnosis present

## 2013-05-25 DIAGNOSIS — D251 Intramural leiomyoma of uterus: Principal | ICD-10-CM | POA: Diagnosis present

## 2013-05-25 DIAGNOSIS — N920 Excessive and frequent menstruation with regular cycle: Secondary | ICD-10-CM | POA: Diagnosis present

## 2013-05-25 HISTORY — PX: ABDOMINAL HYSTERECTOMY: SHX81

## 2013-05-25 HISTORY — PX: BILATERAL SALPINGECTOMY: SHX5743

## 2013-05-25 SURGERY — HYSTERECTOMY, ABDOMINAL
Anesthesia: General | Site: Abdomen | Wound class: Clean Contaminated

## 2013-05-25 MED ORDER — BUPIVACAINE HCL (PF) 0.5 % IJ SOLN
INTRAMUSCULAR | Status: AC
  Filled 2013-05-25: qty 30

## 2013-05-25 MED ORDER — PROPOFOL 10 MG/ML IV EMUL
INTRAVENOUS | Status: AC
  Filled 2013-05-25: qty 20

## 2013-05-25 MED ORDER — CEFAZOLIN SODIUM-DEXTROSE 2-3 GM-% IV SOLR
2.0000 g | INTRAVENOUS | Status: AC
  Administered 2013-05-25: 2 g via INTRAVENOUS

## 2013-05-25 MED ORDER — LIDOCAINE HCL (CARDIAC) 20 MG/ML IV SOLN
INTRAVENOUS | Status: AC
  Filled 2013-05-25: qty 5

## 2013-05-25 MED ORDER — DIPHENHYDRAMINE HCL 12.5 MG/5ML PO ELIX: 12.5000 mg | ORAL_SOLUTION | Freq: Four times a day (QID) | ORAL | Status: DC | PRN

## 2013-05-25 MED ORDER — ONDANSETRON HCL 4 MG/2ML IJ SOLN
INTRAMUSCULAR | Status: DC | PRN
  Administered 2013-05-25: 4 mg via INTRAVENOUS

## 2013-05-25 MED ORDER — KETOROLAC TROMETHAMINE 30 MG/ML IJ SOLN
INTRAMUSCULAR | Status: DC | PRN
  Administered 2013-05-25: 30 mg via INTRAVENOUS

## 2013-05-25 MED ORDER — FENTANYL CITRATE 0.05 MG/ML IJ SOLN
INTRAMUSCULAR | Status: AC
  Filled 2013-05-25: qty 5

## 2013-05-25 MED ORDER — MIDAZOLAM HCL 2 MG/2ML IJ SOLN
INTRAMUSCULAR | Status: AC
  Filled 2013-05-25: qty 2

## 2013-05-25 MED ORDER — HYDROMORPHONE HCL PF 1 MG/ML IJ SOLN
0.2500 mg | INTRAMUSCULAR | Status: DC | PRN
Start: 2013-05-25 — End: 2013-05-25

## 2013-05-25 MED ORDER — ONDANSETRON HCL 4 MG/2ML IJ SOLN
4.0000 mg | Freq: Four times a day (QID) | INTRAMUSCULAR | Status: DC | PRN
  Administered 2013-05-25: 4 mg via INTRAVENOUS
  Filled 2013-05-25: qty 2

## 2013-05-25 MED ORDER — ONDANSETRON HCL 4 MG/2ML IJ SOLN: 4.0000 mg | Freq: Four times a day (QID) | INTRAMUSCULAR | Status: DC | PRN

## 2013-05-25 MED ORDER — IBUPROFEN 800 MG PO TABS
800.0000 mg | ORAL_TABLET | Freq: Three times a day (TID) | ORAL | Status: DC | PRN
  Administered 2013-05-26 – 2013-05-27 (×4): 800 mg via ORAL
  Filled 2013-05-25 (×4): qty 1

## 2013-05-25 MED ORDER — DIPHENHYDRAMINE HCL 50 MG/ML IJ SOLN: 12.5000 mg | Freq: Four times a day (QID) | INTRAMUSCULAR | Status: DC | PRN

## 2013-05-25 MED ORDER — CEFAZOLIN SODIUM-DEXTROSE 2-3 GM-% IV SOLR
INTRAVENOUS | Status: AC
  Filled 2013-05-25: qty 50

## 2013-05-25 MED ORDER — SODIUM CHLORIDE 0.9 % IJ SOLN: 9.0000 mL | INTRAMUSCULAR | Status: DC | PRN

## 2013-05-25 MED ORDER — NEOSTIGMINE METHYLSULFATE 1 MG/ML IJ SOLN
INTRAMUSCULAR | Status: AC
  Filled 2013-05-25: qty 1

## 2013-05-25 MED ORDER — ROCURONIUM BROMIDE 100 MG/10ML IV SOLN
INTRAVENOUS | Status: DC | PRN
  Administered 2013-05-25: 5 mg via INTRAVENOUS
  Administered 2013-05-25: 40 mg via INTRAVENOUS

## 2013-05-25 MED ORDER — DEXAMETHASONE SODIUM PHOSPHATE 4 MG/ML IJ SOLN
INTRAMUSCULAR | Status: DC | PRN
  Administered 2013-05-25: 10 mg via INTRAVENOUS

## 2013-05-25 MED ORDER — MIDAZOLAM HCL 5 MG/5ML IJ SOLN
INTRAMUSCULAR | Status: DC | PRN
  Administered 2013-05-25: 2 mg via INTRAVENOUS

## 2013-05-25 MED ORDER — ONDANSETRON HCL 4 MG/2ML IJ SOLN
INTRAMUSCULAR | Status: AC
  Filled 2013-05-25: qty 2

## 2013-05-25 MED ORDER — ROCURONIUM BROMIDE 50 MG/5ML IV SOLN
INTRAVENOUS | Status: AC
  Filled 2013-05-25: qty 1

## 2013-05-25 MED ORDER — GLYCOPYRROLATE 0.2 MG/ML IJ SOLN
INTRAMUSCULAR | Status: DC | PRN
  Administered 2013-05-25: 0.6 mg via INTRAVENOUS

## 2013-05-25 MED ORDER — FENTANYL CITRATE 0.05 MG/ML IJ SOLN
INTRAMUSCULAR | Status: DC | PRN
  Administered 2013-05-25 (×4): 50 ug via INTRAVENOUS
  Administered 2013-05-25: 100 ug via INTRAVENOUS
  Administered 2013-05-25 (×2): 50 ug via INTRAVENOUS

## 2013-05-25 MED ORDER — KETOROLAC TROMETHAMINE 30 MG/ML IJ SOLN: 15.0000 mg | Freq: Once | INTRAMUSCULAR | Status: DC | PRN

## 2013-05-25 MED ORDER — 0.9 % SODIUM CHLORIDE (POUR BTL) OPTIME
TOPICAL | Status: DC | PRN
  Administered 2013-05-25: 1000 mL

## 2013-05-25 MED ORDER — BUPIVACAINE HCL (PF) 0.5 % IJ SOLN
INTRAMUSCULAR | Status: DC | PRN
  Administered 2013-05-25: 20 mL

## 2013-05-25 MED ORDER — DEXAMETHASONE SODIUM PHOSPHATE 10 MG/ML IJ SOLN
INTRAMUSCULAR | Status: AC
  Filled 2013-05-25: qty 1

## 2013-05-25 MED ORDER — LIDOCAINE HCL (CARDIAC) 20 MG/ML IV SOLN
INTRAVENOUS | Status: DC | PRN
  Administered 2013-05-25: 50 mg via INTRAVENOUS

## 2013-05-25 MED ORDER — NEOSTIGMINE METHYLSULFATE 1 MG/ML IJ SOLN
INTRAMUSCULAR | Status: DC | PRN
  Administered 2013-05-25: 3 mg via INTRAVENOUS

## 2013-05-25 MED ORDER — LACTATED RINGERS IV SOLN
INTRAVENOUS | Status: DC
  Administered 2013-05-26: 02:00:00 via INTRAVENOUS

## 2013-05-25 MED ORDER — HYDROMORPHONE 0.3 MG/ML IV SOLN: INTRAVENOUS | Status: DC

## 2013-05-25 MED ORDER — MEPERIDINE HCL 25 MG/ML IJ SOLN: 6.2500 mg | INTRAMUSCULAR | Status: DC | PRN

## 2013-05-25 MED ORDER — PROPOFOL 10 MG/ML IV BOLUS
INTRAVENOUS | Status: DC | PRN
  Administered 2013-05-25: 200 mg via INTRAVENOUS

## 2013-05-25 MED ORDER — PROMETHAZINE HCL 25 MG/ML IJ SOLN: 6.2500 mg | INTRAMUSCULAR | Status: DC | PRN

## 2013-05-25 MED ORDER — NALOXONE HCL 0.4 MG/ML IJ SOLN: 0.4000 mg | INTRAMUSCULAR | Status: DC | PRN

## 2013-05-25 MED ORDER — INDIGOTINDISULFONATE SODIUM 8 MG/ML IJ SOLN
INTRAMUSCULAR | Status: AC
  Filled 2013-05-25: qty 5

## 2013-05-25 MED ORDER — GLYCOPYRROLATE 0.2 MG/ML IJ SOLN
INTRAMUSCULAR | Status: AC
  Filled 2013-05-25: qty 3

## 2013-05-25 MED ORDER — OXYCODONE-ACETAMINOPHEN 5-325 MG PO TABS
1.0000 | ORAL_TABLET | ORAL | Status: DC | PRN
  Administered 2013-05-25: 1 via ORAL
  Administered 2013-05-26 – 2013-05-27 (×5): 2 via ORAL
  Filled 2013-05-25 (×6): qty 2

## 2013-05-25 MED ORDER — ONDANSETRON HCL 4 MG PO TABS: 4.0000 mg | ORAL_TABLET | Freq: Four times a day (QID) | ORAL | Status: DC | PRN

## 2013-05-25 MED ORDER — PROMETHAZINE HCL 25 MG/ML IJ SOLN
INTRAMUSCULAR | Status: AC
  Administered 2013-05-25: 6.25 mg via INTRAVENOUS
  Filled 2013-05-25: qty 1

## 2013-05-25 MED ORDER — LACTATED RINGERS IV SOLN
INTRAVENOUS | Status: DC
  Administered 2013-05-25 (×3): via INTRAVENOUS

## 2013-05-25 SURGICAL SUPPLY — 28 items
CANISTER SUCTION 2500CC (MISCELLANEOUS) ×3 IMPLANT
CHLORAPREP W/TINT 26ML (MISCELLANEOUS) ×3 IMPLANT
CLOTH BEACON ORANGE TIMEOUT ST (SAFETY) ×3 IMPLANT
CONT PATH 16OZ SNAP LID 3702 (MISCELLANEOUS) ×3 IMPLANT
DECANTER SPIKE VIAL GLASS SM (MISCELLANEOUS) ×3 IMPLANT
DRSG OPSITE POSTOP 4X10 (GAUZE/BANDAGES/DRESSINGS) ×3 IMPLANT
GAUZE SPONGE 4X4 16PLY XRAY LF (GAUZE/BANDAGES/DRESSINGS) ×3 IMPLANT
GLOVE BIO SURGEON STRL SZ 6.5 (GLOVE) ×12 IMPLANT
GLOVE BIOGEL PI IND STRL 7.0 (GLOVE) ×4 IMPLANT
GLOVE BIOGEL PI INDICATOR 7.0 (GLOVE) ×2
GOWN PREVENTION PLUS LG XLONG (DISPOSABLE) ×9 IMPLANT
NEEDLE SPNL 18GX3.5 QUINCKE PK (NEEDLE) ×3 IMPLANT
NS IRRIG 1000ML POUR BTL (IV SOLUTION) ×3 IMPLANT
PACK ABDOMINAL GYN (CUSTOM PROCEDURE TRAY) ×3 IMPLANT
PAD ABD 7.5X8 STRL (GAUZE/BANDAGES/DRESSINGS) ×3 IMPLANT
PAD OB MATERNITY 4.3X12.25 (PERSONAL CARE ITEMS) ×3 IMPLANT
PROTECTOR NERVE ULNAR (MISCELLANEOUS) ×3 IMPLANT
SPONGE LAP 18X18 X RAY DECT (DISPOSABLE) ×3 IMPLANT
STRIP CLOSURE SKIN 1/2X4 (GAUZE/BANDAGES/DRESSINGS) ×3 IMPLANT
SUT CHROMIC 3 0 SH 27 (SUTURE) IMPLANT
SUT VIC AB 0 CT1 36 (SUTURE) ×9 IMPLANT
SUT VIC AB 2-0 CT1 18 (SUTURE) ×9 IMPLANT
SUT VIC AB 3-0 CT1 27 (SUTURE) ×3
SUT VIC AB 3-0 CT1 TAPERPNT 27 (SUTURE) ×2 IMPLANT
SYR 30ML LL (SYRINGE) ×3 IMPLANT
TOWEL OR 17X24 6PK STRL BLUE (TOWEL DISPOSABLE) ×6 IMPLANT
TRAY FOLEY CATH 14FR (SET/KITS/TRAYS/PACK) ×3 IMPLANT
WATER STERILE IRR 1000ML POUR (IV SOLUTION) IMPLANT

## 2013-05-25 NOTE — Op Note (Signed)
05/25/2013  3:11 PM  PATIENT:  Kaitlin Shannon  32 y.o. female  PRE-OPERATIVE DIAGNOSIS:  cpt [phone] - Fibroids, menorrhagia & anemia  POST-OPERATIVE DIAGNOSIS:   Fibroids, menorrhagia & anemia  PROCEDURE:  Procedure(s): HYSTERECTOMY ABDOMINAL (N/A) BILATERAL SALPINGECTOMY (Bilateral)  SURGEON:  Surgeon(s) and Role:    * Allie Bossier, MD - Primary  PHYSICIAN ASSISTANT:   ASSISTANTS: Rulon Abide, MD   ANESTHESIA:   general  EBL:  Total I/O In: 2400 [I.V.:2400] Out: 700 [Urine:300; Blood:400]  BLOOD ADMINISTERED:none  DRAINS: none   LOCAL MEDICATIONS USED:  MARCAINE     SPECIMEN:  Source of Specimen:  uterus, oviducts  DISPOSITION OF SPECIMEN:  PATHOLOGY  COUNTS:  YES  TOURNIQUET:  * No tourniquets in log *  DICTATION: .Dragon Dictation  PLAN OF CARE: Admit to inpatient   PATIENT DISPOSITION:  PACU - hemodynamically stable.   Delay start of Pharmacological VTE agent (>24hrs) due to surgical blood loss or risk of bleeding: yes         The risks, benefits, and alternatives of surgery were explained, understood, and accepted. Consents were signed. All questions were answered. She was taken to the operating room and general anesthesia was applied without complication. Her abdomen and vagina were prepped and draped in the usual sterile fashion. A Foley catheter was placed which drained clear urine throughout the case. 30 mL of 0.5% marcaine was injected into the subcutaneous tissue above the symphysis pubis. A transverse incision was made approximately 2 cm above her symphysis pubis. The incision was carried down through the subcutaneous tissue to the fascia. Bleeding encountered was cauterized with the Bovie. The fascia was scored the midline and the fascial incision was extended bilaterally. The pyramidalis muscles were separated in a transverse fashion using electrosurgical technique. Approximately 2 cm of the rectus muscles were separated in a transverse fashion in the  midline using electrosurgical technique. Hemostasis was maintained. The peritoneum was entered with hemostats and the peritoneal incision was extended bilaterally with the Bovie, taking care to avoid bowel and bladder. The patient was placed in Trendelenburg position and her bowel was packed out of the abdominal cavity. The pelvis was inspected. The uterus was grossly enlarged, filling the opening, obviating the need for a retractor. Coker clamps were used to elevate the uterus. The round ligaments were identified clamped cut and ligated. A bladder flap was created anteriorly and the bladder was pushed out of the operative site with a moist lap sponge. The infundibulopelvic ligaments were identified bilaterally. They were clamped, cut, anddoubly ligated. Excellent hemostasis was noted. 2-0 Vicryl sutures used throughout this case unless otherwise specified. The uterine vessels were skeletonized, clamped, cut, and doubly ligated. The remainder of the cervix was separated from its pelvic attachments using the same clamp, cut, ligate technique. Curved Heaney clamps were used to clamp beneath the cervix. The cervix and uterus were removed and sent to pathology. The vaginal cuff was noted to be hemostatic. The pelvis was irrigated with 1 L of warm normal saline. All pedicles were noted to be hemostatic. The ureters were noted to be functioning and of normal caliber.The rectus muscles were inspected and hemostasis was assured. The fascia was closed with a 0 Vicryl running nonlocking suture. The subcutaneous tissue was irrigated, clean, dry. A subcuticular closure was done with 3-0 vicryl suture. She tolerated the procedure well and was taken to the recovery room in stable condition. Her Foley catheter drained clear urine throughout.

## 2013-05-25 NOTE — Anesthesia Postprocedure Evaluation (Signed)
  Anesthesia Post-op Note  Patient: Kaitlin Shannon  Procedure(s) Performed: Procedure(s): HYSTERECTOMY ABDOMINAL (N/A) BILATERAL SALPINGECTOMY (Bilateral)  Patient Location: PACU  Anesthesia Type:General  Level of Consciousness: awake, alert  and oriented  Airway and Oxygen Therapy: Patient Spontanous Breathing  Post-op Pain: none  Post-op Assessment: Post-op Vital signs reviewed, Patient's Cardiovascular Status Stable, Respiratory Function Stable, Patent Airway, No signs of Nausea or vomiting and Pain level controlled  Post-op Vital Signs: Reviewed and stable  Complications: No apparent anesthesia complications

## 2013-05-25 NOTE — Transfer of Care (Signed)
Immediate Anesthesia Transfer of Care Note  Patient: Kaitlin Shannon  Procedure(s) Performed: Procedure(s): HYSTERECTOMY ABDOMINAL (N/A) BILATERAL SALPINGECTOMY (Bilateral)  Patient Location: PACU  Anesthesia Type:General  Level of Consciousness: awake  Airway & Oxygen Therapy: Patient Spontanous Breathing  Post-op Assessment: Report given to PACU RN  Post vital signs: stable  Filed Vitals:   05/25/13 1530  BP: 137/74  Pulse: 66  Temp:   Resp:     Complications: No apparent anesthesia complications

## 2013-05-25 NOTE — Anesthesia Preprocedure Evaluation (Signed)
Anesthesia Evaluation  Patient identified by MRN, date of birth, ID band Patient awake    Reviewed: Allergy & Precautions, H&P , NPO status , Patient's Chart, lab work & pertinent test results  Airway Mallampati: I TM Distance: >3 FB Neck ROM: full    Dental no notable dental hx. (+) Teeth Intact   Pulmonary    Pulmonary exam normal       Cardiovascular     Neuro/Psych negative neurological ROS  negative psych ROS   GI/Hepatic negative GI ROS, Neg liver ROS,   Endo/Other  negative endocrine ROS  Renal/GU negative Renal ROS     Musculoskeletal   Abdominal Normal abdominal exam  (+)   Peds  Hematology negative hematology ROS (+)   Anesthesia Other Findings   Reproductive/Obstetrics negative OB ROS                           Anesthesia Physical Anesthesia Plan  ASA: II  Anesthesia Plan: General   Post-op Pain Management:    Induction: Intravenous  Airway Management Planned: Oral ETT  Additional Equipment:   Intra-op Plan:   Post-operative Plan: Extubation in OR  Informed Consent: I have reviewed the patients History and Physical, chart, labs and discussed the procedure including the risks, benefits and alternatives for the proposed anesthesia with the patient or authorized representative who has indicated his/her understanding and acceptance.   Dental Advisory Given  Plan Discussed with: CRNA and Surgeon  Anesthesia Plan Comments:         Anesthesia Quick Evaluation

## 2013-05-25 NOTE — H&P (Signed)
Kaitlin Shannon is an 32 y.o. female G52P3 (8,7,6) here today for a TAH/bl salpingectomy for treatment of her fibroids, causing her anemia.   Pertinent Gynecological History: Menses: flow is excessive with use of 10 pads or tampons on heaviest days Bleeding: monthly for 4-7 days Contraception: none/ BTL DES exposure: denies Blood transfusions: none Sexually transmitted diseases: no past history Previous GYN Procedures: none   Last pap: normal Date: 2014    Menstrual History: Menarche age: 49 Patient's last menstrual period was 04/24/2013.    Past Medical History  Diagnosis Date  . Anemia   . Unspecified vitamin D deficiency   . Uterine leiomyoma   . Astigmatism   . Incompetent cervix   . Hypertension     no meds currently    Past Surgical History  Procedure Laterality Date  . Tubal ligation    . Vaginal deliveries  [phone], 2009    Family History  Problem Relation Age of Onset  . COPD Mother   . Seizures Mother   . Heart disease Father     died of cardiac arrest  . Other Father     bowel infection  . Cancer Sister     lung  . Cancer Paternal Grandfather     lung  . Diabetes Neg Hx   . Colon cancer Neg Hx   . Cancer Sister     lung  . Cancer Maternal Grandmother     breast cancer  . Breast cancer Maternal Grandmother     Social History:  reports that she has never smoked. She has never used smokeless tobacco. She reports that  drinks alcohol. She reports that she does not use illicit drugs.  Allergies: No Known Allergies  Prescriptions prior to admission  Medication Sig Dispense Refill  . FeFum-FePoly-FA-B Cmp-C-Biot (INTEGRA PLUS) CAPS Take 1 capsule by mouth daily.  30 capsule  2  . Vitamin D, Ergocalciferol, (DRISDOL) 50000 UNITS CAPS capsule Take 50,000 Units by mouth every 7 (seven) days. tuesday        ROS Her job requires lifting.  Blood pressure 115/66, pulse 56, temperature 98.1 F (36.7 C), temperature source Oral, resp. rate 16, last  menstrual period 04/24/2013, SpO2 100.00%. Physical Exam Heart- rrr Lungs- CTAB Abd- benign, uterus palpable up to U-2  Results for orders placed during the hospital encounter of 05/25/13 (from the past 24 hour(s))  PREGNANCY, URINE     Status: None   Collection Time    05/25/13 11:23 AM      Result Value Range   Preg Test, Ur NEGATIVE  NEGATIVE  TYPE AND SCREEN     Status: None   Collection Time    05/25/13 12:00 PM      Result Value Range   ABO/RH(D) A POS     Antibody Screen PENDING     Sample Expiration 05/28/2013    PREPARE RBC (CROSSMATCH)     Status: None   Collection Time    05/25/13 12:01 PM      Result Value Range   Order Confirmation ORDER PROCESSED BY BLOOD BANK      No results found.  Assessment/Plan: Symptomatic fibroids with anemia. She will have a TAH/bilateral salpingectomy.  She understands the risks of surgery, including, but not to infection, bleeding, DVTs, damage to bowel, bladder, ureters. She wishes to proceed.    Oluwademilade Kellett C. 05/25/2013, 12:53 PM

## 2013-05-26 ENCOUNTER — Encounter (HOSPITAL_COMMUNITY): Payer: Self-pay | Admitting: Obstetrics & Gynecology

## 2013-05-26 LAB — CBC
HCT: 23.4 % — ABNORMAL LOW (ref 36.0–46.0)
MCH: 20.1 pg — ABNORMAL LOW (ref 26.0–34.0)
MCV: 64.5 fL — ABNORMAL LOW (ref 78.0–100.0)
RBC: 3.63 MIL/uL — ABNORMAL LOW (ref 3.87–5.11)
WBC: 11.2 10*3/uL — ABNORMAL HIGH (ref 4.0–10.5)

## 2013-05-26 NOTE — Progress Notes (Signed)
Ur chart review completed.  

## 2013-05-26 NOTE — Anesthesia Postprocedure Evaluation (Signed)
Anesthesia Post Note  Patient: Kaitlin Shannon  Procedure(s) Performed: Procedure(s) (LRB): HYSTERECTOMY ABDOMINAL (N/A) BILATERAL SALPINGECTOMY (Bilateral)  Anesthesia type: General  Patient location: Mother/Baby  Post pain: Pain level controlled  Post assessment: Post-op Vital signs reviewed  Last Vitals:  Filed Vitals:   05/26/13 0623  BP: 132/84  Pulse: 99  Temp: 37.6 C  Resp: 18    Post vital signs: Reviewed  Level of consciousness: awake and alert   Complications: No apparent anesthesia complications

## 2013-05-27 LAB — CBC
HCT: 27.1 % — ABNORMAL LOW (ref 36.0–46.0)
Hemoglobin: 8.7 g/dL — ABNORMAL LOW (ref 12.0–15.0)
MCHC: 32.1 g/dL (ref 30.0–36.0)
MCV: 69.5 fL — ABNORMAL LOW (ref 78.0–100.0)
RDW: 24.3 % — ABNORMAL HIGH (ref 11.5–15.5)

## 2013-05-27 MED ORDER — IBUPROFEN 800 MG PO TABS: 800.0000 mg | ORAL_TABLET | Freq: Three times a day (TID) | ORAL | Status: DC | PRN

## 2013-05-27 MED ORDER — OXYCODONE-ACETAMINOPHEN 5-325 MG PO TABS: 1.0000 | ORAL_TABLET | Freq: Four times a day (QID) | ORAL | Status: DC | PRN

## 2013-05-27 MED ORDER — OXYCODONE-ACETAMINOPHEN 5-325 MG PO TABS: 1.0000 | ORAL_TABLET | ORAL | Status: DC | PRN

## 2013-05-27 NOTE — Progress Notes (Signed)
Patient D/C in the care of her husband via wheelchair. Received D/C instructions and stated she understood them. All questions from the patient were answered. Patient was stable and stated pain was better before leaving.

## 2013-05-27 NOTE — Discharge Summary (Signed)
Physician Discharge Summary  Patient ID: Kaitlin Shannon MRN: [id number] DOB/AGE: [birth year] 34 y.o.  Admit date: 05/25/2013 Discharge date: 05/27/2013  Admission Diagnoses: symptomatic fibroids, menorrhagia, anemia  Discharge Diagnoses: same Active Problems:   * No active hospital problems. *   Discharged Condition: good  Hospital Course: She underwent an uncomplicated TAH/bilateral salpingectomy under GETA. Her post op course was uncomplicated. I did transfuse her 2 units of PRBCs post operatively. We had discussed this as the plan even at her pre op visit. Her POD#2 HBG was 8.7, pre operatively 8.8. She did well post operatively, having flatus, voiding, ambulating, and tolerating po without nausea or vomitting. She voiced her readiness to go home on POD #2.  Consults: None  Significant Diagnostic Studies: labs: as above   Treatments: surgery: as above  Discharge Exam: Blood pressure 117/69, pulse 56, temperature 97.6 F (36.4 C), temperature source Oral, resp. rate 18, height 5\' 9"  (1.753 m), weight 83.462 kg (184 lb), last menstrual period 04/24/2013, SpO2 100.00%. General appearance: alert Resp: clear to auscultation bilaterally Cardio: regular rate and rhythm, S1, S2 normal, no murmur, click, rub or gallop GI: soft, non-tender; bowel sounds normal; no masses,  no organomegaly Incision/Wound: honeycomb dressing intact with only old blood noted.  Disposition: 01-Home or Self Care   Future Appointments Provider Department Dept Phone   06/02/2013 9:15 AM Joselyn Arrow, MD [facility] [phone]   07/07/2013 12:45 PM Allie Bossier, MD [facility] [phone]       Medication List         ibuprofen 800 MG tablet  Commonly known as:  ADVIL,MOTRIN  Take 1 tablet (800 mg total) by mouth every 8 (eight) hours as needed (mild pain).     INTEGRA PLUS Caps  Take 1 capsule by mouth daily.     oxyCODONE-acetaminophen 5-325 MG per tablet  Commonly known as:   PERCOCET/ROXICET  Take 1-2 tablets by mouth every 4 (four) hours as needed.     Vitamin D (Ergocalciferol) 50000 UNITS Caps capsule  Commonly known as:  DRISDOL  Take 50,000 Units by mouth every 7 (seven) days. tuesday           Follow-up Information   Schedule an appointment as soon as possible for a visit with Allie Bossier., MD.   Specialty:  Obstetrics and Gynecology   Contact information:   [address] [place] [address] [phone]       Signed: Allie Bossier. 05/27/2013, 1:19 PM

## 2013-05-28 LAB — TYPE AND SCREEN
ABO/RH(D): A POS
Unit division: 0
Unit division: 0
Unit division: 0

## 2013-06-02 ENCOUNTER — Ambulatory Visit: Payer: No Typology Code available for payment source | Admitting: Family Medicine

## 2013-06-06 ENCOUNTER — Inpatient Hospital Stay (HOSPITAL_COMMUNITY)
Admission: AD | Admit: 2013-06-06 | Discharge: 2013-06-06 | Disposition: A | Discharge status: Home / Self Care | Payer: 59 | Source: Ambulatory Visit | Attending: Obstetrics & Gynecology | Admitting: Obstetrics & Gynecology

## 2013-06-06 ENCOUNTER — Encounter (HOSPITAL_COMMUNITY): Payer: Self-pay | Admitting: *Deleted

## 2013-06-06 DIAGNOSIS — R609 Edema, unspecified: Secondary | ICD-10-CM

## 2013-06-06 DIAGNOSIS — N9089 Other specified noninflammatory disorders of vulva and perineum: Secondary | ICD-10-CM | POA: Insufficient documentation

## 2013-06-06 DIAGNOSIS — N949 Unspecified condition associated with female genital organs and menstrual cycle: Secondary | ICD-10-CM | POA: Insufficient documentation

## 2013-06-06 LAB — CBC
MCH: 22.5 pg — ABNORMAL LOW (ref 26.0–34.0)
MCHC: 31.9 g/dL (ref 30.0–36.0)
MCV: 70.5 fL — ABNORMAL LOW (ref 78.0–100.0)
Platelets: 356 10*3/uL (ref 150–400)
RBC: 4.58 MIL/uL (ref 3.87–5.11)

## 2013-06-06 LAB — WET PREP, GENITAL: Yeast Wet Prep HPF POC: NONE SEEN

## 2013-06-06 MED ORDER — OXYCODONE-ACETAMINOPHEN 5-325 MG PO TABS: 1.0000 | ORAL_TABLET | Freq: Four times a day (QID) | ORAL | Status: DC | PRN

## 2013-06-06 MED ORDER — OXYCODONE-ACETAMINOPHEN 5-325 MG PO TABS
2.0000 | ORAL_TABLET | ORAL | Status: AC
  Administered 2013-06-06: 2 via ORAL
  Filled 2013-06-06: qty 2

## 2013-06-06 NOTE — MAU Provider Note (Signed)
Attestation of Attending Supervision of Advanced Practitioner (CNM/NP): Evaluation and management procedures were performed by the Advanced Practitioner under my supervision and collaboration.  I have reviewed the Advanced Practitioner's note and chart, and I agree with the management and plan.  HARRAWAY-SMITH, Breigh Annett 4:36 PM     

## 2013-06-06 NOTE — MAU Provider Note (Signed)
Chief Complaint: No chief complaint on file.   First Provider Initiated Contact with Patient 06/06/13 1201     SUBJECTIVE HPI: Kaitlin Shannon is a 32 y.o. [id number] on Day12 post TAH/salpingectomy who presents to maternity admissions reporting sharp vaginal pain.  She points to her left labial area as the area with the most discomfort.  She denies pain other than mild soreness at the abdominal incision site.  She denies vaginal bleeding, vaginal itching/burning, urinary symptoms, h/a, dizziness, n/v, or fever/chills.    Past Medical History  Diagnosis Date  . Anemia   . Unspecified vitamin D deficiency   . Uterine leiomyoma   . Astigmatism   . Incompetent cervix   . Hypertension     no meds currently   Past Surgical History  Procedure Laterality Date  . Tubal ligation    . Vaginal deliveries  [id number], 2009  . Abdominal hysterectomy N/A 05/25/2013    Procedure: HYSTERECTOMY ABDOMINAL;  Surgeon: Allie Bossier, MD;  Location: [facility];  Service: Gynecology;  Laterality: N/A;  . Bilateral salpingectomy Bilateral 05/25/2013    Procedure: BILATERAL SALPINGECTOMY;  Surgeon: Allie Bossier, MD;  Location: [facility];  Service: Gynecology;  Laterality: Bilateral;   History   Social History  . Marital Status: Married    Spouse Name: N/A    Number of Children: 3  . Years of Education: N/A   Occupational History  . [employer]    Social History Main Topics  . Smoking status: Never Smoker   . Smokeless tobacco: Never Used  . Alcohol Use: Yes     Comment: once or twice a year  . Drug Use: No  . Sexual Activity: Yes    Partners: Male    Birth Control/ Protection: Surgical   Other Topics Concern  . Not on file   Social History Narrative   Lives with husband, 3 kids   No current facility-administered medications on file prior to encounter.   Current Outpatient Prescriptions on File Prior to Encounter  Medication Sig Dispense Refill  . ibuprofen (ADVIL,MOTRIN) 800 MG tablet Take 1 tablet  (800 mg total) by mouth every 8 (eight) hours as needed (mild pain).  30 tablet  0   No Known Allergies  ROS: Pertinent items in HPI  OBJECTIVE Blood pressure 147/81, pulse 61, temperature 97.9 F (36.6 C), temperature source Oral, resp. rate 16, last menstrual period 04/27/2013.   GENERAL: Well-developed, well-nourished female in no acute distress.  HEENT: Normocephalic HEART: normal rate RESP: normal effort ABDOMEN: Soft, non-tender EXTREMITIES: Nontender, no edema NEURO: Alert and oriented SPECULUM EXAM: vaginal cuff with good approximation, no erythema, no edema, moderate amount thick white discharge, vaginal walls normal, mild edema of labia and mons noted, soft but pt pain reproducible with palpation of this area Removed honeycomb dressing and steri-strips while in MAU. Pt tolerated well.  Incision well approximated without erythema, edema, or exudate  LAB RESULTS Results for orders placed during the hospital encounter of 06/06/13 (from the past 24 hour(s))  CBC     Status: Abnormal   Collection Time    06/06/13 12:39 PM      Result Value Range   WBC 5.4  4.0 - 10.5 K/uL   RBC 4.58  3.87 - 5.11 MIL/uL   Hemoglobin 10.3 (*) 12.0 - 15.0 g/dL   HCT 91.4 (*) 78.2 - 95.6 %   MCV 70.5 (*) 78.0 - 100.0 fL   MCH 22.5 (*) 26.0 - 34.0 pg   MCHC  31.9  30.0 - 36.0 g/dL   RDW 96.0 (*) 45.4 - 09.8 %   Platelets 356  150 - 400 K/uL  WET PREP, GENITAL     Status: Abnormal   Collection Time    06/06/13 12:50 PM      Result Value Range   Yeast Wet Prep HPF POC NONE SEEN  NONE SEEN   Trich, Wet Prep NONE SEEN  NONE SEEN   Clue Cells Wet Prep HPF POC NONE SEEN  NONE SEEN   WBC, Wet Prep HPF POC MANY (*) NONE SEEN    ASSESSMENT 1. Dependent edema     PLAN Percocet x2 in MAU Discharge home Ice, position change, compression as needed for swelling Percocet 5/325 Q 6 hours x20 tabs Continue ibuprofen as prescribed Clean incision with soap/water daily Keep f/u appointment with Dr  Marice Potter Return to MAU as needed    Medication List    ASK your doctor about these medications       ibuprofen 800 MG tablet  Commonly known as:  ADVIL,MOTRIN  Take 1 tablet (800 mg total) by mouth every 8 (eight) hours as needed (mild pain).         Sharen Counter Certified Nurse-Midwife 06/06/2013  12:25 PM

## 2013-06-06 NOTE — MAU Note (Signed)
Pt states having pain in internal vaginal area. Denies bleeding. Pain began yesterday am and has gradually became worse.

## 2013-06-06 NOTE — MAU Note (Signed)
Abdominal dressing intact, old bleeding noted at bottom of dressing which had been marked.  No new bleeding noted.  Pt denies incisional pain, states it is just sore.

## 2013-07-07 ENCOUNTER — Ambulatory Visit (INDEPENDENT_AMBULATORY_CARE_PROVIDER_SITE_OTHER): Payer: PRIVATE HEALTH INSURANCE | Admitting: Obstetrics & Gynecology

## 2013-07-07 VITALS — BP 127/72 | HR 61 | Temp 97.2°F | Ht 69.0 in | Wt 178.0 lb

## 2013-07-07 DIAGNOSIS — Z09 Encounter for follow-up examination after completed treatment for conditions other than malignant neoplasm: Secondary | ICD-10-CM

## 2013-07-07 NOTE — Progress Notes (Signed)
  Subjective:    Patient ID: Kaitlin Shannon, female    DOB: 1981/03/22, 32 y.o.   MRN: [id number]  HPI  32 yo lady now 6 weeks po s/p TAH/bilateral salpingectomy for fibroids and anemia. She denies problems. She reports normal bowel and bladder function. She has not had sex since surgery.  Review of Systems     Objective:   Physical Exam Well-healed incision and vaginal cuff Normal bimanual exam       Assessment & Plan:  Post op doing well She declines a flu vaccine today. RTC 1 year/prn sooner

## 2013-07-07 NOTE — Patient Instructions (Signed)

## 2013-07-12 ENCOUNTER — Encounter: Payer: Self-pay | Admitting: Obstetrics & Gynecology

## 2014-08-01 ENCOUNTER — Encounter: Payer: Self-pay | Admitting: Obstetrics & Gynecology

## 2014-12-24 ENCOUNTER — Emergency Department (HOSPITAL_COMMUNITY)
Admission: EM | Admit: 2014-12-24 | Discharge: 2014-12-24 | Disposition: A | Discharge status: Home / Self Care | Payer: BLUE CROSS/BLUE SHIELD | Source: Home / Self Care | Attending: Emergency Medicine | Admitting: Emergency Medicine

## 2014-12-24 ENCOUNTER — Encounter (HOSPITAL_COMMUNITY): Payer: Self-pay

## 2014-12-24 DIAGNOSIS — R5383 Other fatigue: Secondary | ICD-10-CM | POA: Diagnosis not present

## 2014-12-24 LAB — POCT I-STAT, CHEM 8
BUN: 9 mg/dL (ref 6–23)
CHLORIDE: 102 mmol/L (ref 96–112)
CREATININE: 0.6 mg/dL (ref 0.50–1.10)
Calcium, Ion: 1.06 mmol/L — ABNORMAL LOW (ref 1.12–1.23)
GLUCOSE: 92 mg/dL (ref 70–99)
HEMATOCRIT: 40 % (ref 36.0–46.0)
Hemoglobin: 13.6 g/dL (ref 12.0–15.0)
Potassium: 3.9 mmol/L (ref 3.5–5.1)
SODIUM: 138 mmol/L (ref 135–145)
TCO2: 23 mmol/L (ref 0–100)

## 2014-12-24 LAB — TSH: TSH: 0.926 u[IU]/mL (ref 0.350–4.500)

## 2014-12-24 NOTE — ED Provider Notes (Signed)
CSN: [id number]     Arrival date & time 12/24/14  1309 History   First MD Initiated Contact with Patient 12/24/14 1436     Chief Complaint  Patient presents with  . Fatigue   (Consider location/radiation/quality/duration/timing/severity/associated sxs/prior Treatment) HPI She is a 34 year old woman here for evaluation of fatigue. She states for the last 2-3 days she has had decreased energy and feels tired all the time. This is associated with a sensation that she is "breathing harder." She denies chest pain or tightness, but states she is breathing differently. She did have an episode of right sided chest discomfort last week which she attributed to heartburn. No leg swelling. No recent plane rides or long car trips. No hormone replacement.  She does have a history of anemia and states this feels similar, but worse. No known source of bleeding as she had a hysterectomy 2 years ago.  Past Medical History  Diagnosis Date  . Anemia   . Unspecified vitamin D deficiency   . Uterine leiomyoma   . Astigmatism   . Incompetent cervix   . Hypertension     no meds currently   Past Surgical History  Procedure Laterality Date  . Tubal ligation    . Vaginal deliveries  0347,4259, 2009  . Abdominal hysterectomy N/A 05/25/2013    Procedure: HYSTERECTOMY ABDOMINAL;  Surgeon: Emily Filbert, MD;  Location: [facility];  Service: Gynecology;  Laterality: N/A;  . Bilateral salpingectomy Bilateral 05/25/2013    Procedure: BILATERAL SALPINGECTOMY;  Surgeon: Emily Filbert, MD;  Location: [facility];  Service: Gynecology;  Laterality: Bilateral;   Family History  Problem Relation Age of Onset  . COPD Mother   . Seizures Mother   . Heart disease Father     died of cardiac arrest  . Other Father     bowel infection  . Cancer Sister     lung  . Cancer Paternal Grandfather     lung  . Diabetes Neg Hx   . Colon cancer Neg Hx   . Cancer Sister     lung  . Cancer Maternal Grandmother     breast cancer  . Breast  cancer Maternal Grandmother    History  Substance Use Topics  . Smoking status: Never Smoker   . Smokeless tobacco: Never Used  . Alcohol Use: Yes     Comment: once or twice a year   OB History    Gravida Para Term Preterm AB TAB SAB Ectopic Multiple Living   5 3 1 2 2  2   3      Review of Systems  Constitutional: Positive for activity change and fatigue. Negative for fever.  HENT: Negative.   Respiratory: Positive for shortness of breath. Negative for cough and chest tightness.   Cardiovascular: Positive for chest pain. Negative for palpitations and leg swelling.  Gastrointestinal: Negative for nausea and vomiting.  Musculoskeletal: Negative for myalgias.  Neurological: Negative for dizziness and headaches.    Allergies  Review of patient's allergies indicates no known allergies.  Home Medications   Prior to Admission medications   Medication Sig Start Date End Date Taking? Authorizing Provider  ibuprofen (ADVIL,MOTRIN) 800 MG tablet Take 1 tablet (800 mg total) by mouth every 8 (eight) hours as needed (mild pain). 05/27/13   Emily Filbert, MD  oxyCODONE-acetaminophen (PERCOCET/ROXICET) 5-325 MG per tablet Take 1-2 tablets by mouth every 6 (six) hours as needed for pain. 06/06/13   Kathie Dike Leftwich-Kirby, CNM   BP  141/95 mmHg  Pulse 72  Temp(Src) 99.1 F (37.3 C) (Oral)  Resp 12  SpO2 100%  LMP 04/27/2013 Physical Exam  Constitutional: She is oriented to person, place, and time. She appears well-developed and well-nourished. No distress.  HENT:  Head: Normocephalic and atraumatic.  Mouth/Throat: Oropharynx is clear and moist.  Neck: Neck supple. No thyromegaly present.  Cardiovascular: Normal rate and regular rhythm.   No murmur heard. Pulmonary/Chest: Effort normal and breath sounds normal. No respiratory distress. She has no wheezes. She has no rales.  Lymphadenopathy:    She has no cervical adenopathy.  Neurological: She is alert and oriented to person, place, and  time.    ED Course  Procedures (including critical care time) ED ECG REPORT   Date: 12/24/2014  Rate: 68  Rhythm: normal sinus rhythm  QRS Axis: normal  Intervals: normal  ST/T Wave abnormalities: normal  Conduction Disutrbances:none  Narrative Interpretation: normal ekg  Old EKG Reviewed: changes noted and new t-wave inversion in V2 only  I have personally reviewed the EKG tracing and agree with the computerized printout as noted.  Labs Review Labs Reviewed  POCT I-STAT, CHEM 8 - Abnormal; Notable for the following:    Calcium, Ion 1.06 (*)    All other components within normal limits  TSH    Imaging Review No results found.   MDM   1. Other fatigue    Vital signs are stable. I-STAT reviewed and within normal limits. Hemoglobin is good 13. EKG is normal. TSH has been drawn. Recommend a daily multivitamin and plenty of fluids. She is working on getting a PCP at this time. Follow-up as needed.    Melony Overly, MD 12/24/14 1524

## 2014-12-24 NOTE — Discharge Instructions (Signed)
Start a daily multivitamin. Make sure you drinking playing of fluids. Do not overdo it. We will call you if your thyroid is abnormal. Follow-up as needed.

## 2014-12-24 NOTE — ED Notes (Signed)
Reported history of anemia. C/o  Has no energy, sluggish feeling, tired all the time. Had hysterectomy . Feels like she is not breathing deeply enough

## 2014-12-26 ENCOUNTER — Telehealth (HOSPITAL_COMMUNITY): Payer: Self-pay | Admitting: *Deleted

## 2014-12-27 NOTE — ED Notes (Signed)
TSH 0.926 3/28 Message sent to Dr. Bridgett Larsson.  3/29  She told him we would only call if abnormal.  No further action needed. 12/27/2014

## 2016-07-02 DIAGNOSIS — L0291 Cutaneous abscess, unspecified: Secondary | ICD-10-CM | POA: Diagnosis not present

## 2016-07-02 DIAGNOSIS — Z23 Encounter for immunization: Secondary | ICD-10-CM | POA: Diagnosis not present

## 2016-07-02 DIAGNOSIS — R51 Headache: Secondary | ICD-10-CM | POA: Diagnosis not present

## 2016-07-02 DIAGNOSIS — J309 Allergic rhinitis, unspecified: Secondary | ICD-10-CM | POA: Diagnosis not present

## 2016-09-02 DIAGNOSIS — Z136 Encounter for screening for cardiovascular disorders: Secondary | ICD-10-CM | POA: Diagnosis not present

## 2016-09-02 DIAGNOSIS — Z13220 Encounter for screening for lipoid disorders: Secondary | ICD-10-CM | POA: Diagnosis not present

## 2016-09-02 DIAGNOSIS — Z0000 Encounter for general adult medical examination without abnormal findings: Secondary | ICD-10-CM | POA: Diagnosis not present

## 2016-09-04 DIAGNOSIS — Z23 Encounter for immunization: Secondary | ICD-10-CM | POA: Diagnosis not present

## 2016-09-04 DIAGNOSIS — Z0000 Encounter for general adult medical examination without abnormal findings: Secondary | ICD-10-CM | POA: Diagnosis not present

## 2016-09-04 DIAGNOSIS — Z6827 Body mass index (BMI) 27.0-27.9, adult: Secondary | ICD-10-CM | POA: Diagnosis not present

## 2016-10-22 DIAGNOSIS — N906 Unspecified hypertrophy of vulva: Secondary | ICD-10-CM | POA: Diagnosis not present

## 2016-10-22 DIAGNOSIS — Z6828 Body mass index (BMI) 28.0-28.9, adult: Secondary | ICD-10-CM | POA: Diagnosis not present

## 2016-10-30 DIAGNOSIS — N906 Unspecified hypertrophy of vulva: Secondary | ICD-10-CM | POA: Diagnosis not present

## 2016-11-06 DIAGNOSIS — Z6828 Body mass index (BMI) 28.0-28.9, adult: Secondary | ICD-10-CM | POA: Diagnosis not present

## 2016-11-06 DIAGNOSIS — N906 Unspecified hypertrophy of vulva: Secondary | ICD-10-CM | POA: Diagnosis not present

## 2016-11-06 DIAGNOSIS — Z23 Encounter for immunization: Secondary | ICD-10-CM | POA: Diagnosis not present

## 2016-11-15 ENCOUNTER — Inpatient Hospital Stay (HOSPITAL_COMMUNITY)
Admission: AD | Admit: 2016-11-15 | Discharge: 2016-11-15 | Disposition: A | Discharge status: Home / Self Care | Payer: BLUE CROSS/BLUE SHIELD | Source: Ambulatory Visit | Attending: Obstetrics and Gynecology | Admitting: Obstetrics and Gynecology

## 2016-11-15 ENCOUNTER — Other Ambulatory Visit: Payer: Self-pay | Admitting: Obstetrics and Gynecology

## 2016-11-15 DIAGNOSIS — N99820 Postprocedural hemorrhage and hematoma of a genitourinary system organ or structure following a genitourinary system procedure: Secondary | ICD-10-CM | POA: Diagnosis not present

## 2016-11-15 DIAGNOSIS — N9069 Other specified hypertrophy of vulva: Secondary | ICD-10-CM | POA: Diagnosis not present

## 2016-11-15 DIAGNOSIS — N906 Unspecified hypertrophy of vulva: Secondary | ICD-10-CM | POA: Diagnosis not present

## 2016-11-15 MED ORDER — LIDOCAINE HCL (PF) 1 % IJ SOLN
INTRAMUSCULAR | Status: AC
  Filled 2016-11-15: qty 30

## 2016-11-15 NOTE — MAU Note (Addendum)
Pt had a moderate amount of bleeding upon arrival, Dr. Gaetano Net drained a hematoma that had formed and resutured the area. All instruments accounted for.  Pt given follow-up care instructions and is in stable condition, ready to be discharged.

## 2016-11-15 NOTE — Discharge Instructions (Signed)
-  Ice packs as needed -shower as normal -put on a pad and sit upright to apply pressure to the area -keep scheduled followup appointment

## 2016-11-15 NOTE — MAU Note (Signed)
Dr.Tomblin at bedside

## 2016-11-15 NOTE — MAU Provider Note (Signed)
  History   36 yo S/P hysterectomy S/P reduction of left labia minora about 7;30 am today. Was discharged doing well. At home started to bleed and had a large clot. Called EMS for transport to [facility].  CSN: [id number]  Arrival date and time: 11/15/16 1331   First Provider Initiated Contact with Patient 11/15/16 1341      No chief complaint on file.  HPI  Pertinent Gynecological History: Menses: hysterectomy Bleeding: N/A Contraception: hysterectomy DES exposure: denies Blood transfusions: none Sexually transmitted diseases: no past history Previous GYN Procedures: see above  Last mammogram: unknown Date: N/A Last pap: unknown Date: N/A   Past Medical History:  Diagnosis Date  . Anemia   . Astigmatism   . Hypertension    no meds currently  . Incompetent cervix   . Unspecified vitamin D deficiency   . Uterine leiomyoma     Past Surgical History:  Procedure Laterality Date  . ABDOMINAL HYSTERECTOMY N/A 05/25/2013   Procedure: HYSTERECTOMY ABDOMINAL;  Surgeon: Emily Filbert, MD;  Location: [facility];  Service: Gynecology;  Laterality: N/A;  . BILATERAL SALPINGECTOMY Bilateral 05/25/2013   Procedure: BILATERAL SALPINGECTOMY;  Surgeon: Emily Filbert, MD;  Location: [facility];  Service: Gynecology;  Laterality: Bilateral;  . TUBAL LIGATION    . vaginal deliveries  2005,2007, 2009    Family History  Problem Relation Age of Onset  . COPD Mother   . Seizures Mother   . Heart disease Father     died of cardiac arrest  . Other Father     bowel infection  . Cancer Sister     lung  . Cancer Paternal Grandfather     lung  . Diabetes Neg Hx   . Colon cancer Neg Hx   . Cancer Sister     lung  . Cancer Maternal Grandmother     breast cancer  . Breast cancer Maternal Grandmother     Social History  Substance Use Topics  . Smoking status: Never Smoker  . Smokeless tobacco: Never Used  . Alcohol use Yes     Comment: once or twice a year    Allergies: No Known  Allergies  Prescriptions Prior to Admission  Medication Sig Dispense Refill Last Dose  . ibuprofen (ADVIL,MOTRIN) 800 MG tablet Take 1 tablet (800 mg total) by mouth every 8 (eight) hours as needed (mild pain). 30 tablet 0 Unknown at Unknown time  . oxyCODONE-acetaminophen (PERCOCET/ROXICET) 5-325 MG per tablet Take 1-2 tablets by mouth every 6 (six) hours as needed for pain. 20 tablet 0 Not Taking    Review of Systems Physical Exam   Last menstrual period 04/27/2013.  Physical Exam Patient in NAD Lower 1/2 of left labia minora has the interrupted sutures are gone and a 2 cm clot evacuated from labia. Small amount of bleeding easily controlled with pressure. MAU Course  Procedures After discussion with patient, the left labia is injected with 1% lidocaine. Interrupted 4-0 vicryl suture placed. Good hemostasis noted. Patient tolerates very well. MDM As above  Assessment and Plan  36 yo with PO bleeding, now controlled and stable. Advise pressure with pad and sitting position. Ice prn. Will FU in office next week  Kaytelyn Glore II,Heran Campau E 11/15/2016, 2:13 PM

## 2016-11-15 NOTE — MAU Note (Signed)
Pt came EMS after having a large amount of bleeding after having a labial reduction surgery this morning at 0700.  Dr. Gaetano Net came to evaluate her, he did her surgery this morning as well.

## 2017-03-07 DIAGNOSIS — F329 Major depressive disorder, single episode, unspecified: Secondary | ICD-10-CM | POA: Diagnosis not present

## 2017-03-07 DIAGNOSIS — Z6829 Body mass index (BMI) 29.0-29.9, adult: Secondary | ICD-10-CM | POA: Diagnosis not present

## 2017-03-07 DIAGNOSIS — Z23 Encounter for immunization: Secondary | ICD-10-CM | POA: Diagnosis not present

## 2017-03-07 DIAGNOSIS — R635 Abnormal weight gain: Secondary | ICD-10-CM | POA: Diagnosis not present

## 2017-03-07 DIAGNOSIS — R5383 Other fatigue: Secondary | ICD-10-CM | POA: Diagnosis not present

## 2017-04-09 DIAGNOSIS — Z6829 Body mass index (BMI) 29.0-29.9, adult: Secondary | ICD-10-CM | POA: Diagnosis not present

## 2017-05-07 DIAGNOSIS — L700 Acne vulgaris: Secondary | ICD-10-CM | POA: Diagnosis not present

## 2017-05-07 DIAGNOSIS — Z6828 Body mass index (BMI) 28.0-28.9, adult: Secondary | ICD-10-CM | POA: Diagnosis not present

## 2017-05-07 DIAGNOSIS — H579 Unspecified disorder of eye and adnexa: Secondary | ICD-10-CM | POA: Diagnosis not present

## 2017-06-04 DIAGNOSIS — L700 Acne vulgaris: Secondary | ICD-10-CM | POA: Diagnosis not present

## 2017-06-04 DIAGNOSIS — H53459 Other localized visual field defect, unspecified eye: Secondary | ICD-10-CM | POA: Diagnosis not present

## 2017-06-09 DIAGNOSIS — H18411 Arcus senilis, right eye: Secondary | ICD-10-CM | POA: Diagnosis not present

## 2017-07-29 DIAGNOSIS — L409 Psoriasis, unspecified: Secondary | ICD-10-CM | POA: Diagnosis not present

## 2017-07-29 DIAGNOSIS — L700 Acne vulgaris: Secondary | ICD-10-CM | POA: Diagnosis not present

## 2017-07-29 DIAGNOSIS — Z6829 Body mass index (BMI) 29.0-29.9, adult: Secondary | ICD-10-CM | POA: Diagnosis not present

## 2017-07-29 DIAGNOSIS — L309 Dermatitis, unspecified: Secondary | ICD-10-CM | POA: Diagnosis not present

## 2017-09-03 DIAGNOSIS — Z114 Encounter for screening for human immunodeficiency virus [HIV]: Secondary | ICD-10-CM | POA: Diagnosis not present

## 2017-09-03 DIAGNOSIS — Z118 Encounter for screening for other infectious and parasitic diseases: Secondary | ICD-10-CM | POA: Diagnosis not present

## 2017-09-03 DIAGNOSIS — L709 Acne, unspecified: Secondary | ICD-10-CM | POA: Diagnosis not present

## 2017-09-03 DIAGNOSIS — N760 Acute vaginitis: Secondary | ICD-10-CM | POA: Diagnosis not present

## 2017-09-03 DIAGNOSIS — L309 Dermatitis, unspecified: Secondary | ICD-10-CM | POA: Diagnosis not present

## 2017-09-03 DIAGNOSIS — Z0000 Encounter for general adult medical examination without abnormal findings: Secondary | ICD-10-CM | POA: Diagnosis not present

## 2017-09-03 DIAGNOSIS — Z23 Encounter for immunization: Secondary | ICD-10-CM | POA: Diagnosis not present

## 2017-09-03 DIAGNOSIS — Z1329 Encounter for screening for other suspected endocrine disorder: Secondary | ICD-10-CM | POA: Diagnosis not present

## 2017-09-03 DIAGNOSIS — N898 Other specified noninflammatory disorders of vagina: Secondary | ICD-10-CM | POA: Diagnosis not present

## 2017-09-10 DIAGNOSIS — Z6829 Body mass index (BMI) 29.0-29.9, adult: Secondary | ICD-10-CM | POA: Diagnosis not present

## 2017-09-10 DIAGNOSIS — Z0000 Encounter for general adult medical examination without abnormal findings: Secondary | ICD-10-CM | POA: Diagnosis not present

## 2018-02-13 DIAGNOSIS — H15102 Unspecified episcleritis, left eye: Secondary | ICD-10-CM | POA: Diagnosis not present

## 2018-03-11 DIAGNOSIS — M549 Dorsalgia, unspecified: Secondary | ICD-10-CM | POA: Diagnosis not present

## 2018-03-11 DIAGNOSIS — K5900 Constipation, unspecified: Secondary | ICD-10-CM | POA: Diagnosis not present

## 2018-03-11 DIAGNOSIS — L309 Dermatitis, unspecified: Secondary | ICD-10-CM | POA: Diagnosis not present

## 2018-03-11 DIAGNOSIS — L709 Acne, unspecified: Secondary | ICD-10-CM | POA: Diagnosis not present

## 2018-04-08 DIAGNOSIS — H5213 Myopia, bilateral: Secondary | ICD-10-CM | POA: Diagnosis not present

## 2018-05-27 DIAGNOSIS — Z6831 Body mass index (BMI) 31.0-31.9, adult: Secondary | ICD-10-CM | POA: Diagnosis not present

## 2018-05-27 DIAGNOSIS — M25372 Other instability, left ankle: Secondary | ICD-10-CM | POA: Diagnosis not present

## 2018-05-27 DIAGNOSIS — R600 Localized edema: Secondary | ICD-10-CM | POA: Diagnosis not present

## 2018-07-22 DIAGNOSIS — Z23 Encounter for immunization: Secondary | ICD-10-CM | POA: Diagnosis not present

## 2018-08-21 DIAGNOSIS — L309 Dermatitis, unspecified: Secondary | ICD-10-CM | POA: Diagnosis not present

## 2018-09-14 DIAGNOSIS — Z13220 Encounter for screening for lipoid disorders: Secondary | ICD-10-CM | POA: Diagnosis not present

## 2018-09-14 DIAGNOSIS — Z1329 Encounter for screening for other suspected endocrine disorder: Secondary | ICD-10-CM | POA: Diagnosis not present

## 2018-09-14 DIAGNOSIS — Z114 Encounter for screening for human immunodeficiency virus [HIV]: Secondary | ICD-10-CM | POA: Diagnosis not present

## 2018-09-14 DIAGNOSIS — Z0000 Encounter for general adult medical examination without abnormal findings: Secondary | ICD-10-CM | POA: Diagnosis not present

## 2018-10-21 DIAGNOSIS — Z6831 Body mass index (BMI) 31.0-31.9, adult: Secondary | ICD-10-CM | POA: Diagnosis not present

## 2018-10-21 DIAGNOSIS — Z841 Family history of disorders of kidney and ureter: Secondary | ICD-10-CM | POA: Diagnosis not present

## 2018-11-24 DIAGNOSIS — R030 Elevated blood-pressure reading, without diagnosis of hypertension: Secondary | ICD-10-CM | POA: Diagnosis not present

## 2018-11-24 DIAGNOSIS — Z733 Stress, not elsewhere classified: Secondary | ICD-10-CM | POA: Diagnosis not present

## 2018-11-24 DIAGNOSIS — Z6830 Body mass index (BMI) 30.0-30.9, adult: Secondary | ICD-10-CM | POA: Diagnosis not present

## 2018-11-30 DIAGNOSIS — I10 Essential (primary) hypertension: Secondary | ICD-10-CM | POA: Diagnosis not present

## 2018-11-30 DIAGNOSIS — Z6830 Body mass index (BMI) 30.0-30.9, adult: Secondary | ICD-10-CM | POA: Diagnosis not present

## 2019-01-06 DIAGNOSIS — J309 Allergic rhinitis, unspecified: Secondary | ICD-10-CM | POA: Diagnosis not present

## 2019-01-06 DIAGNOSIS — G4700 Insomnia, unspecified: Secondary | ICD-10-CM | POA: Diagnosis not present

## 2019-03-22 DIAGNOSIS — Z0000 Encounter for general adult medical examination without abnormal findings: Secondary | ICD-10-CM | POA: Diagnosis not present

## 2019-03-23 DIAGNOSIS — Z6830 Body mass index (BMI) 30.0-30.9, adult: Secondary | ICD-10-CM | POA: Diagnosis not present

## 2019-03-23 DIAGNOSIS — Z0000 Encounter for general adult medical examination without abnormal findings: Secondary | ICD-10-CM | POA: Diagnosis not present

## 2019-03-26 ENCOUNTER — Other Ambulatory Visit: Payer: Self-pay | Admitting: Family Medicine

## 2019-03-26 DIAGNOSIS — Z1231 Encounter for screening mammogram for malignant neoplasm of breast: Secondary | ICD-10-CM

## 2019-06-24 ENCOUNTER — Other Ambulatory Visit: Payer: Self-pay

## 2019-06-24 DIAGNOSIS — Z20822 Contact with and (suspected) exposure to covid-19: Secondary | ICD-10-CM

## 2019-06-25 LAB — NOVEL CORONAVIRUS, NAA: SARS-CoV-2, NAA: NOT DETECTED

## 2019-08-20 ENCOUNTER — Other Ambulatory Visit: Payer: Self-pay

## 2019-08-20 DIAGNOSIS — Z20822 Contact with and (suspected) exposure to covid-19: Secondary | ICD-10-CM

## 2019-08-22 LAB — NOVEL CORONAVIRUS, NAA: SARS-CoV-2, NAA: NOT DETECTED

## 2019-12-02 ENCOUNTER — Ambulatory Visit: Payer: Medicaid Other | Admitting: Physical Therapy

## 2019-12-07 ENCOUNTER — Ambulatory Visit: Payer: No Typology Code available for payment source | Attending: Certified Nurse Midwife | Admitting: Physical Therapy

## 2019-12-07 ENCOUNTER — Encounter: Payer: Self-pay | Admitting: Physical Therapy

## 2019-12-07 ENCOUNTER — Other Ambulatory Visit: Payer: Self-pay

## 2019-12-07 DIAGNOSIS — R252 Cramp and spasm: Secondary | ICD-10-CM

## 2019-12-07 DIAGNOSIS — M6281 Muscle weakness (generalized): Secondary | ICD-10-CM | POA: Insufficient documentation

## 2019-12-07 NOTE — Patient Instructions (Signed)
Moisturizers . They are used in the vagina to hydrate the mucous membrane that make up the vaginal canal. . Designed to keep a more normal acid balance (ph) . Once placed in the vagina, it will last between two to three days.  . Use 2-3 times per week at bedtime  . Ingredients to avoid is glycerin and fragrance, can increase chance of infection . Should not be used just before sex due to causing irritation . Most are gels administered either in a tampon-shaped applicator or as a vaginal suppository. They are non-hormonal.   Types of Moisturizers  . Vitamin E vaginal suppositories- Whole foods, Amazon . Moist Again . Coconut oil- can break down condoms . Julva- (Do no use if on Tamoxifen) amazon . Yes moisturizer- amazon . NeuEve Silk , NeuEve Silver for menopausal or over 65 (if have severe vaginal atrophy or cancer treatments use NeuEve Silk for  1 month than move to NeuEve Silver)- Amazon, Neuve.com . Olive and Bee intimate cream- www.oliveandbee.com.au . Mae vaginal moisturizer- Amazon . Aloe .    Creams to use externally on the Vulva area  Desert Harvest Releveum (good for for cancer patients that had radiation to the area)- amazon or www.desertharvest.com  V-magic cream - amazon  Julva-amazon  Vital "V Wild Yam salve ( help moisturize and help with thinning vulvar area, does have Beeswax  MoodMaid Botanical Pro-Meno Wild Yam Cream- Amazon  Desert Harvest Gele  Cleo by Damiva labial moisturizer (Amazon,   Coconut or olive oil  aloe   Things to avoid in the vaginal area . Do not use things to irritate the vulvar area . No lotions just specialized creams for the vulva area- Neogyn, V-magic, No soaps; can use Aveeno or Calendula cleanser if needed. Must be gentle . No deodorants . No douches . Good to sleep without underwear to let the vaginal area to air out . No scrubbing: spread the lips to let warm water rinse over labias and pat dry  [facility] [address], [address] [place], [place] [address] Phone # [phone] Fax [phone]   

## 2019-12-07 NOTE — Therapy (Signed)
[facility] [address], [place], [place], [address] Phone: [phone]   Fax:  [phone]  Physical Therapy Evaluation  Patient Details  Name: Kaitlin Shannon MRN: [id number] Date of Birth: August 29, 1981 Referring Provider (PT): Dr. Linda Hedges   Encounter Date: 12/07/2019  PT End of Session - 12/07/19 1127    Visit Number  1    Date for PT Re-Evaluation  02/01/20    Authorization Type  UHC    PT Start Time  1100    PT Stop Time  1140    PT Time Calculation (min)  40 min    Activity Tolerance  Patient tolerated treatment well;No increased pain    Behavior During Therapy  WFL for tasks assessed/performed       Past Medical History:  Diagnosis Date  . Anemia   . Astigmatism   . Hypertension    no meds currently  . Incompetent cervix   . Unspecified vitamin D deficiency   . Uterine leiomyoma     Past Surgical History:  Procedure Laterality Date  . ABDOMINAL HYSTERECTOMY N/A 05/25/2013   Procedure: HYSTERECTOMY ABDOMINAL;  Surgeon: Emily Filbert, MD;  Location: [facility];  Service: Gynecology;  Laterality: N/A;  . BILATERAL SALPINGECTOMY Bilateral 05/25/2013   Procedure: BILATERAL SALPINGECTOMY;  Surgeon: Emily Filbert, MD;  Location: [facility];  Service: Gynecology;  Laterality: Bilateral;  . TUBAL LIGATION    . vaginal deliveries  2005,2007, 2009    There were no vitals filed for this visit.   Subjective Assessment - 12/07/19 1106    Subjective  Patient reports her pelvic pain started 10/01/2019. Patient would feel like a deep pain in the vagina. Pain comes and goes and is intense sometimes.    Patient Stated Goals  reduce pain    Currently in Pain?  Yes    Pain Score  5     Pain Location  Vagina    Pain Orientation  Mid    Pain Descriptors / Indicators  Throbbing    Pain Type  Acute pain    Pain Onset  More than a month ago    Pain Frequency  Intermittent    Aggravating Factors   after intercourse,    Pain  Relieving Factors  heat pad    Multiple Pain Sites  No         [facility] PT Assessment - 12/07/19 0001      Assessment   Medical Diagnosis  R10.2 Pelvic pain in female    Referring Provider (PT)  Dr. Linda Hedges    Onset Date/Surgical Date  10/01/19    Prior Therapy  none      Precautions   Precautions  None      Restrictions   Weight Bearing Restrictions  No      Balance Screen   Has the patient fallen in the past 6 months  No    Has the patient had a decrease in activity level because of a fear of falling?   No    Is the patient reluctant to leave their home because of a fear of falling?   No      Home Film/video editor residence      Prior Function   Level of Independence  Independent    Leisure  walking      Cognition   Overall Cognitive Status  Within Functional Limits for tasks assessed  ROM / Strength   AROM / PROM / Strength  AROM;PROM;Strength      PROM   Right Hip External Rotation   40    Left Hip External Rotation   35      Strength   Right Hip ABduction  4/5    Right Hip ADduction  4/5    Left Hip Extension  4/5   pain in mid vagina   Left Hip ABduction  5/5    Left Hip ADduction  5/5      Palpation   SI assessment   right ASIS anteriorly rotated    Palpation comment  tightness in the abdomen; tenderness located in right gluteals,                 Objective measurements completed on examination: See above findings.    Pelvic Floor Special Questions - 12/07/19 0001    Prior Pregnancies  Yes    Number of Pregnancies  3    Number of Vaginal Deliveries  3    Currently Sexually Active  Yes    Is this Painful  No    Marinoff Scale  no problems   pain after intercourse   Urinary Leakage  No    Fecal incontinence  No    Falling out feeling (prolapse)  No    Pelvic Floor Internal Exam  Patient confirms identification and approves PT to assess pelvic floor and treatment    Exam Type  Vaginal    Palpation   tenderness located on the left side of the pelvic floor muscles    Strength  fair squeeze, definite lift    Strength # of seconds  3       [facility] Adult PT Treatment/Exercise - 12/07/19 0001      Self-Care   Self-Care  Other Self-Care Comments    Other Self-Care Comments   education on vaginal moisturizers and how to use externally and internally             PT Education - 12/07/19 1139    Education Details  information on vaginal moisturizers    Person(s) Educated  Patient    Methods  Explanation;Handout    Comprehension  Verbalized understanding       PT Short Term Goals - 12/07/19 1149      PT SHORT TERM GOAL #1   Title  independent with intial HEP    Time  4    Period  Weeks    Status  New    Target Date  01/04/20      PT SHORT TERM GOAL #2   Title  education on vaginal dryness and ways to moisturize the area for good vaginal health    Time  4    Period  Weeks    Status  New    Target Date  01/04/20      PT SHORT TERM GOAL #3   Title  pelvic pain decreased >/= 25% due to reduction of pelvic floor muscle spasms    Time  4    Period  Weeks    Status  New    Target Date  01/04/20        PT Long Term Goals - 12/07/19 1151      PT LONG TERM GOAL #1   Title  independent with advanced HEP    Time  8    Period  Weeks    Status  New    Target Date  02/01/20  PT LONG TERM GOAL #2   Title  pelvic pain decreased to minimal to none after intercourse or resistive hip movements    Time  8    Period  Weeks    Status  New    Target Date  02/01/20      PT LONG TERM GOAL #3   Title  pelvic strength increased to 4/5 holding for 10 seconds to improve support    Time  8    Period  Weeks    Status  New    Target Date  02/01/20             Plan - 12/07/19 1130    Clinical Impression Statement  Patient is a 39 year old female with pelvic pain since 10/01/2019 with sudden onset. Patient reports her pain is 5/10 after intercourse and with hip resistance.  Patient has decreased bilateral hip external rotation ROM. Patient has weakness in bilateral hips. Patient has tenderness located on the left pelvic floor muscles, left abdominals, left gluteals. Pelvic floor strength is 3/5 holding for 3 seconds. Patient has dryness in the vaginal area. Patient will benefit from skilled therapy to reduce her pain and improve quality of life.    Personal Factors and Comorbidities  Comorbidity 3+;Sex    Comorbidities  uterine leiomyoma; Hypertension; abdominal hysterectomy 05/25/2013; vaginal deliveries 3x    Examination-Participation Restrictions  Interpersonal Relationship    Stability/Clinical Decision Making  Stable/Uncomplicated    Clinical Decision Making  Low    Rehab Potential  Excellent    PT Frequency  1x / week    PT Duration  8 weeks    PT Treatment/Interventions  Biofeedback;Cryotherapy;Electrical Stimulation;Moist Heat;Neuromuscular re-education;Therapeutic exercise;Therapeutic activities;Patient/family education;Manual techniques    PT Next Visit Plan  pelvic floor soft tissue work on left, correct ilium, lubricant education, pelvic floor strength, abdominal massage    Consulted and Agree with Plan of Care  Patient       Patient will benefit from skilled therapeutic intervention in order to improve the following deficits and impairments:  Decreased coordination, Increased fascial restricitons, Pain, Decreased strength  Visit Diagnosis: Muscle weakness (generalized) - Plan: PT plan of care cert/re-cert  Cramp and spasm - Plan: PT plan of care cert/re-cert     Problem List Patient Active Problem List   Diagnosis Date Noted  . Anemia, iron deficiency 02/04/2013  . Unspecified vitamin D deficiency 01/27/2013  . RECTAL BLEEDING 11/30/2008  . FIBROIDS, UTERUS 10/18/2008  . ASTHMA 10/18/2008  . ANEMIA 07/08/2008  . OBESITY 07/07/2008  . ELEVATED BLOOD PRESSURE 07/07/2008    Kaitlin Shannon, PT 12/07/19 11:54 AM   [facility] [address] [place], [place], [address] Phone: [phone]   Fax:  [phone]  Name: Kaitlin Shannon MRN: [id number] Date of Birth: [birth year]

## 2019-12-09 ENCOUNTER — Telehealth: Payer: Self-pay | Admitting: Family Medicine

## 2019-12-09 NOTE — Telephone Encounter (Signed)
Dismissal letter in guarantor snapshot  °

## 2019-12-16 ENCOUNTER — Encounter: Payer: Self-pay | Admitting: Physical Therapy

## 2019-12-16 ENCOUNTER — Other Ambulatory Visit: Payer: Self-pay

## 2019-12-16 ENCOUNTER — Ambulatory Visit: Payer: No Typology Code available for payment source | Admitting: Physical Therapy

## 2019-12-16 DIAGNOSIS — M6281 Muscle weakness (generalized): Secondary | ICD-10-CM

## 2019-12-16 DIAGNOSIS — R252 Cramp and spasm: Secondary | ICD-10-CM

## 2019-12-16 NOTE — Patient Instructions (Addendum)
Lubrication . Used for intercourse to reduce friction . Avoid ones that have glycerin, warming gels, tingling gels, icing or cooling gel, scented . Avoid parabens due to a preservative similar to female sex hormone . May need to be reapplied once or several times during sexual activity . Can be applied to both partners genitals prior to vaginal penetration to minimize friction or irritation . Prevent irritation and mucosal tears that cause post coital pain and increased the risk of vaginal and urinary tract infections . Oil-based lubricants cannot be used with condoms due to breaking them down.  Least likely to irritate vaginal tissue.  . Plant based-lubes are safe . Silicone-based lubrication are thicker and last long and used for post-menopausal women  Vaginal Lubricators Here is a list of some suggested lubricators you can use for intercourse. Use the most hypoallergenic product.  You can place on you or your partner.   Slippery Stuff ( water based)  Sylk or Sliquid Natural H2O ( good  if frequent UTI's)- walmart, amazon  Sliquid organics silk-(aloe and silicone based )  [employer] (www.blossom-organics.com)- (aloe based )  Coconut oil, olive oil -not good with condoms   PJur Woman Nude- (water based) amazon  Uberlube- ( silicon) [place] has an organic one  Yes lubricant- (water based and has plant oil based similar to silicone) [employer] Platinum-Silicone, Target, Walgreens  Olive and Bee intimate cream-  www.oliveandbee.com.au  [place] Things to avoid in lubricants are glycerin, warming gels, tingling gels, icing or cooling  gels, and scented gels.  Also avoid Vaseline. KY jelly, Replens, and Astroglide kills good bacteria(lactobacilli)  Things to avoid in the vaginal area . Do not use things to irritate the vulvar area . No lotions- see below . No soaps; can use Aveeno or Calendula  cleanser if needed. Must be gentle . No deodorants . No douches . Good to sleep without underwear to let the vaginal area to air out . No scrubbing: spread the lips to let warm water rinse over labias and pat dry  Creams that can be used on the Vulva Area  V Bank [employer], walmart  Vital V Wild Yam Salve  Julva- [employer] Botanical Pro-Meno Wild Yam Cream  Coconut oil, olive oil  Cleo by [employer] labial moisturizer -Amazon,   Desert [place] Releveum ( lidocaine) or Desert [employer] Insurance Code: [id number] URL: [email or web address] Date: 12/16/2019 Prepared by: Earlie Counts  Exercises Supine Figure 4 Piriformis Stretch - 1 x daily - 7 x weekly - 10 reps - 3 sets Supine Hamstring Stretch - 1 x daily - 7 x weekly - 1 sets - 1 reps - 30 sec hold Supine Lower Trunk Rotation - 1 x daily - 7 x weekly - 1 sets - 2 reps - 30 sec hold Supine Pelvic Floor Stretch - 1 x daily - 7 x weekly - 1 sets - 1 reps - 1-2 min hold Child's Pose Stretch - 1 x daily - 7 x weekly - 1 sets - 1 reps - 30 sec hold Quadruped Cat Camel - 1 x daily - 7 x weekly - 1 sets - 10 reps Supine Abdominal Wall Massage - 1 x daily - 7 x weekly - 1 reps - 3- 5 minutes hold V Sit Hip Adductor Hamstring Stretch - 1 x daily - 7 x weekly - 1 sets - 1 reps - 30 sec hold  [facility] [address], [place], [place] [address] Phone # [phone] Fax [phone]

## 2019-12-16 NOTE — Therapy (Signed)
[facility] [address] [place], [place], [address] Phone: [phone]   Fax:  [phone]  Physical Therapy Treatment  Patient Details  Name: Kaitlin Shannon MRN: [id number] Date of Birth: 1981-08-26 Referring Provider (PT): Dr. Linda Hedges   Encounter Date: 12/16/2019  PT End of Session - 12/16/19 1225    Visit Number  2    Date for PT Re-Evaluation  02/01/20    Authorization Type  UHC    PT Start Time  [id number]    PT Stop Time  1225    PT Time Calculation (min)  40 min    Activity Tolerance  Patient tolerated treatment well;No increased pain    Behavior During Therapy  WFL for tasks assessed/performed       Past Medical History:  Diagnosis Date  . Anemia   . Astigmatism   . Hypertension    no meds currently  . Incompetent cervix   . Unspecified vitamin D deficiency   . Uterine leiomyoma     Past Surgical History:  Procedure Laterality Date  . ABDOMINAL HYSTERECTOMY N/A 05/25/2013   Procedure: HYSTERECTOMY ABDOMINAL;  Surgeon: Emily Filbert, MD;  Location: [facility];  Service: Gynecology;  Laterality: N/A;  . BILATERAL SALPINGECTOMY Bilateral 05/25/2013   Procedure: BILATERAL SALPINGECTOMY;  Surgeon: Emily Filbert, MD;  Location: [facility];  Service: Gynecology;  Laterality: Bilateral;  . TUBAL LIGATION    . vaginal deliveries  2005,2007, 2009    There were no vitals filed for this visit.  Subjective Assessment - 12/16/19 1148    Subjective  The pelvic floor was irritated.    Patient Stated Goals  reduce pain    Currently in Pain?  Yes    Pain Score  2     Pain Location  Vagina    Pain Orientation  Mid    Pain Descriptors / Indicators  Throbbing    Pain Type  Acute pain    Pain Onset  More than a month ago    Pain Frequency  Intermittent    Aggravating Factors   after intercourse    Pain Relieving Factors  heat pad    Multiple Pain Sites  No                       [facility] Adult PT  Treatment/Exercise - 12/16/19 0001      Self-Care   Self-Care  Other Self-Care Comments    Other Self-Care Comments   education on lubricants and how some ingredients can irritate the vaginal area. Provided patient with some samples.       Lumbar Exercises: Stretches   Active Hamstring Stretch  Right;Left;1 rep;30 seconds    Active Hamstring Stretch Limitations  supine    Lower Trunk Rotation  2 reps;30 seconds    Lower Trunk Rotation Limitations  right, left    Quadruped Mid Back Stretch  1 rep;30 seconds    Quadruped Mid Back Stretch Limitations  childs pose    Piriformis Stretch  Right;Left;1 rep;30 seconds    Piriformis Stretch Limitations  supine    Other Lumbar Stretch Exercise  Happy Baby stretch    Other Lumbar Stretch Exercise  sitting hip adductor stretch in sitting      Lumbar Exercises: Quadruped   [place]cat/Old Horse  10 reps      Manual Therapy   Manual Therapy  Muscle Energy Technique;Soft tissue mobilization    Soft tissue mobilization  circular  massage to promote intestinal motion to reduce constipation and relax the pelvic floor    Muscle Energy Technique  correct left and right ilium             PT Education - 12/16/19 1224    Education Details  Access Code: [email or web address]; information on vaginal lubricants    Person(s) Educated  Patient    Methods  Explanation;Demonstration;Verbal cues;Handout    Comprehension  Verbalized understanding;Returned demonstration       PT Short Term Goals - 12/16/19 1230      PT SHORT TERM GOAL #1   Title  independent with intial HEP    Time  4    Period  Weeks    Status  On-going      PT SHORT TERM GOAL #2   Title  education on vaginal dryness and ways to moisturize the area for good vaginal health    Time  4    Period  Weeks    Status  Achieved      PT SHORT TERM GOAL #3   Title  pelvic pain decreased >/= 25% due to reduction of pelvic floor muscle spasms    Time  4    Period  Weeks    Status  On-going         PT Long Term Goals - 12/07/19 1151      PT LONG TERM GOAL #1   Title  independent with advanced HEP    Time  8    Period  Weeks    Status  New    Target Date  02/01/20      PT LONG TERM GOAL #2   Title  pelvic pain decreased to minimal to none after intercourse or resistive hip movements    Time  8    Period  Weeks    Status  New    Target Date  02/01/20      PT LONG TERM GOAL #3   Title  pelvic strength increased to 4/5 holding for 10 seconds to improve support    Time  8    Period  Weeks    Status  New    Target Date  02/01/20            Plan - 12/16/19 1226    Clinical Impression Statement  Patientwas achy after last visit. Patient has learned stretches to reduce pelvic floor tightness. Patient has learned how to perform abominal massage to improve constipation and relax the pelvic floor. Patient is looking for coconut oil to work on vaginal dryness. Patient was educated on vaginal lubricants to help with pain with intercourse. Patient will benefit from skilled therapy to reduce her pain and improve quality of life.    Personal Factors and Comorbidities  Comorbidity 3+;Sex    Comorbidities  uterine leiomyoma; Hypertension; abdominal hysterectomy 05/25/2013; vaginal deliveries 3x    Examination-Participation Restrictions  Interpersonal Relationship    Stability/Clinical Decision Making  Stable/Uncomplicated    Rehab Potential  Excellent    PT Frequency  1x / week    PT Duration  8 weeks    PT Treatment/Interventions  Biofeedback;Cryotherapy;Electrical Stimulation;Moist Heat;Neuromuscular re-education;Therapeutic exercise;Therapeutic activities;Patient/family education;Manual techniques    PT Next Visit Plan  soft tissue work to inner thigh, work on pelvic floor muscles, update goals    PT Home Exercise Plan  Access Code: [email or web address]    Recommended Other Services  MD signed intial eval    Consulted and Agree with Plan of Care  Patient       Patient will benefit  from skilled therapeutic intervention in order to improve the following deficits and impairments:  Decreased coordination, Increased fascial restricitons, Pain, Decreased strength  Visit Diagnosis: Muscle weakness (generalized)  Cramp and spasm     Problem List Patient Active Problem List   Diagnosis Date Noted  . Anemia, iron deficiency 02/04/2013  . Unspecified vitamin D deficiency 01/27/2013  . RECTAL BLEEDING 11/30/2008  . FIBROIDS, UTERUS 10/18/2008  . ASTHMA 10/18/2008  . ANEMIA 07/08/2008  . OBESITY 07/07/2008  . ELEVATED BLOOD PRESSURE 07/07/2008    Earlie Counts, PT 12/16/19 12:31 PM   [facility] [address] [place], [place], [address] Phone: [phone]   Fax:  [phone]  Name: OTYLIA ORLOFF MRN: [id number] Date of Birth: December 22, 1980

## 2019-12-21 ENCOUNTER — Encounter: Payer: Self-pay | Admitting: Physical Therapy

## 2019-12-21 ENCOUNTER — Ambulatory Visit: Payer: No Typology Code available for payment source | Admitting: Physical Therapy

## 2019-12-21 ENCOUNTER — Other Ambulatory Visit: Payer: Self-pay

## 2019-12-21 DIAGNOSIS — M6281 Muscle weakness (generalized): Secondary | ICD-10-CM

## 2019-12-21 DIAGNOSIS — R252 Cramp and spasm: Secondary | ICD-10-CM

## 2019-12-21 NOTE — Therapy (Signed)
[facility] [address], [place], [place], [address] Phone: [phone]   Fax:  [phone]  Physical Therapy Treatment  Patient Details  Name: Kaitlin Shannon MRN: [id number] Date of Birth: August 01, 1981 Referring Provider (PT): Dr. Linda Hedges   Encounter Date: 12/21/2019  PT End of Session - 12/21/19 0814    Visit Number  3    Date for PT Re-Evaluation  02/01/20    Authorization Type  [employer]    PT Start Time  [id number]   came late   PT Stop Time  0845    PT Time Calculation (min)  35 min    Activity Tolerance  Patient tolerated treatment well;No increased pain    Behavior During Therapy  WFL for tasks assessed/performed       Past Medical History:  Diagnosis Date  . Anemia   . Astigmatism   . Hypertension    no meds currently  . Incompetent cervix   . Unspecified vitamin D deficiency   . Uterine leiomyoma     Past Surgical History:  Procedure Laterality Date  . ABDOMINAL HYSTERECTOMY N/A 05/25/2013   Procedure: HYSTERECTOMY ABDOMINAL;  Surgeon: Emily Filbert, MD;  Location: [facility];  Service: Gynecology;  Laterality: N/A;  . BILATERAL SALPINGECTOMY Bilateral 05/25/2013   Procedure: BILATERAL SALPINGECTOMY;  Surgeon: Emily Filbert, MD;  Location: [facility];  Service: Gynecology;  Laterality: Bilateral;  . TUBAL LIGATION    . vaginal deliveries  2005,2007, 2009    There were no vitals filed for this visit.  Subjective Assessment - 12/21/19 0811    Subjective  I felt good after last visit. I felt more relaxed.    Patient Stated Goals  reduce pain    Currently in Pain?  Yes    Pain Score  2     Pain Location  Vagina    Pain Orientation  Mid    Pain Descriptors / Indicators  Throbbing    Pain Type  Acute pain    Pain Onset  More than a month ago    Pain Frequency  Intermittent    Aggravating Factors   after intercourse    Pain Relieving Factors  heat pad    Multiple Pain Sites  No                     Pelvic Floor Special Questions - 12/21/19 0001    Pelvic Floor Internal Exam  Patient confirms identification and approves PT to assess pelvic floor and treatment    Exam Type  Vaginal    Palpation  tenderness externally on the left supreficial transverse        [facility] Adult PT Treatment/Exercise - 12/21/19 0001      Neuro Re-ed    Neuro Re-ed Details   patient places hands on ribs to breath and expand the diaphragm to relax the pelvic floor      Lumbar Exercises: Aerobic   Nustep  level 1 for 5 min while assessing patient      Manual Therapy   Manual Therapy  Soft tissue mobilization    Soft tissue mobilization  to bilateral hip adductors and hip flexors ; left side of perineum, left superior transverse perineum, left side of levator ani             PT Education - 12/21/19 0843    Education Details  360 breathing into the rib cage to relax the pelvic floor  Person(s) Educated  Patient    Methods  Explanation;Demonstration    Comprehension  Verbalized understanding;Returned demonstration       PT Short Term Goals - 12/21/19 0815      PT SHORT TERM GOAL #1   Title  independent with intial HEP    Time  4    Period  Weeks    Status  Achieved    Target Date  01/04/20      PT SHORT TERM GOAL #2   Title  education on vaginal dryness and ways to moisturize the area for good vaginal health    Time  4    Period  Weeks    Status  Achieved    Target Date  01/04/20      PT SHORT TERM GOAL #3   Title  pelvic pain decreased >/= 25% due to reduction of pelvic floor muscle spasms    Time  4    Period  Weeks    Status  Achieved    Target Date  01/04/20        PT Long Term Goals - 12/07/19 1151      PT LONG TERM GOAL #1   Title  independent with advanced HEP    Time  8    Period  Weeks    Status  New    Target Date  02/01/20      PT LONG TERM GOAL #2   Title  pelvic pain decreased to minimal to none after intercourse or resistive  hip movements    Time  8    Period  Weeks    Status  New    Target Date  02/01/20      PT LONG TERM GOAL #3   Title  pelvic strength increased to 4/5 holding for 10 seconds to improve support    Time  8    Period  Weeks    Status  New    Target Date  02/01/20            Plan - 12/21/19 0815    Clinical Impression Statement  Patient reports her pelvic pain is 50% better. Patient has difficulty with expanding her rib cage with breath and needs tactile cues. Patient was able to tolerate soft tissue work to the left pelvic floor externally. Patient had more tightness in the right hip adductors and hip flexors. Patient will benefit from skilled therapy to reduce her pain and improve quality of life.    Personal Factors and Comorbidities  Comorbidity 3+;Sex    Comorbidities  uterine leiomyoma; Hypertension; abdominal hysterectomy 05/25/2013; vaginal deliveries 3x    Examination-Participation Restrictions  Interpersonal Relationship    Stability/Clinical Decision Making  Stable/Uncomplicated    Rehab Potential  Excellent    PT Frequency  1x / week    PT Duration  8 weeks    PT Treatment/Interventions  Biofeedback;Cryotherapy;Electrical Stimulation;Moist Heat;Neuromuscular re-education;Therapeutic exercise;Therapeutic activities;Patient/family education;Manual techniques    PT Next Visit Plan  soft tissue work to inner thigh, work on pelvic floor muscles internally on left, work on breath    PT Home Exercise Plan  Access Code: [email or web address] and Agree with Plan of Care  Patient       Patient will benefit from skilled therapeutic intervention in order to improve the following deficits and impairments:  Decreased coordination, Increased fascial restricitons, Pain, Decreased strength  Visit Diagnosis: Muscle weakness (generalized)  Cramp and spasm     Problem List Patient Active  Problem List   Diagnosis Date Noted  . Anemia, iron deficiency 02/04/2013  . Unspecified  vitamin D deficiency 01/27/2013  . RECTAL BLEEDING 11/30/2008  . FIBROIDS, UTERUS 10/18/2008  . ASTHMA 10/18/2008  . ANEMIA 07/08/2008  . OBESITY 07/07/2008  . ELEVATED BLOOD PRESSURE 07/07/2008    Earlie Counts, PT 12/21/19 8:46 AM   [facility] [address], [place], [place], [address] Phone: [phone]   Fax:  [phone]  Name: Kaitlin Shannon MRN: [id number] Date of Birth: 1980/10/25

## 2019-12-28 ENCOUNTER — Ambulatory Visit: Payer: No Typology Code available for payment source | Admitting: Physical Therapy

## 2019-12-28 ENCOUNTER — Encounter: Payer: Self-pay | Admitting: Physical Therapy

## 2019-12-28 ENCOUNTER — Other Ambulatory Visit: Payer: Self-pay

## 2019-12-28 DIAGNOSIS — M6281 Muscle weakness (generalized): Secondary | ICD-10-CM | POA: Diagnosis not present

## 2019-12-28 DIAGNOSIS — R252 Cramp and spasm: Secondary | ICD-10-CM

## 2019-12-28 NOTE — Therapy (Signed)
[facility] [address] [place], [place], [address] Phone: [phone]   Fax:  [phone]  Physical Therapy Treatment  Patient Details  Name: Kaitlin Shannon MRN: [id number] Date of Birth: [birth year] Referring Provider (PT): Dr. Linda Hedges   Encounter Date: 12/28/2019  PT End of Session - 12/28/19 1053    Visit Number  4    Date for PT Re-Evaluation  02/01/20    Authorization Type  UHC    PT Start Time  [id number]    PT Stop Time  1055    PT Time Calculation (min)  40 min    Activity Tolerance  Patient tolerated treatment well;No increased pain    Behavior During Therapy  WFL for tasks assessed/performed       Past Medical History:  Diagnosis Date  . Anemia   . Astigmatism   . Hypertension    no meds currently  . Incompetent cervix   . Unspecified vitamin D deficiency   . Uterine leiomyoma     Past Surgical History:  Procedure Laterality Date  . ABDOMINAL HYSTERECTOMY N/A 05/25/2013   Procedure: HYSTERECTOMY ABDOMINAL;  Surgeon: Emily Filbert, MD;  Location: [facility];  Service: Gynecology;  Laterality: N/A;  . BILATERAL SALPINGECTOMY Bilateral 05/25/2013   Procedure: BILATERAL SALPINGECTOMY;  Surgeon: Emily Filbert, MD;  Location: [facility];  Service: Gynecology;  Laterality: Bilateral;  . TUBAL LIGATION    . vaginal deliveries  2005,2007, 2009    There were no vitals filed for this visit.  Subjective Assessment - 12/28/19 1021    Subjective  I had had tenderness located in the vagina and right buttocks. I had a spasm. Patient reports her spasm pain after intercourse is 80% better.    Patient Stated Goals  reduce pain    Currently in Pain?  Yes    Pain Score  3     Pain Location  Vagina    Pain Orientation  Mid    Pain Descriptors / Indicators  Spasm    Pain Type  Acute pain    Pain Onset  More than a month ago    Pain Frequency  Intermittent    Aggravating Factors   after intercourse like the following  day    Pain Relieving Factors  heat         [facility] PT Assessment - 12/28/19 0001      PROM   Right Hip External Rotation   65      Palpation   SI assessment   right ASIS anteriorly rotated; sacrum rotated right                   [facility] Adult PT Treatment/Exercise - 12/28/19 0001      Self-Care   Self-Care  Other Self-Care Comments    Other Self-Care Comments   education on using a tennis ball to massage the muscles around the right ishial tuberosisty and right lumbar to redue the spasms      Lumbar Exercises: Stretches   Other Lumbar Stretch Exercise  supine contract relax to right hip to increase Hip ER      Lumbar Exercises: Supine   Bridge  20 reps;1 second    Bridge Limitations  ball between knees and roll up spine for segmental mobility      Lumbar Exercises: Sidelying   Hip Abduction  Right;10 reps;1 second    Hip Abduction Weights (lbs)  3 sets  Manual Therapy   Manual Therapy  Joint mobilization;Muscle Energy Technique;Soft tissue mobilization    Joint Mobilization  sacral mobilization; PA mobilization T4-L5    Soft tissue mobilization  soft tissue work and trigger point work to the right levator ani, ER of the hip, medial to the right ishcial tuberosity    Muscle Energy Technique  correct left and right ilium             PT Education - 12/28/19 1053    Education Details  educated on how to use a tennis ball to massage the back and buttock trigger points    Person(s) Educated  Patient    Methods  Explanation;Demonstration;Verbal cues;Handout    Comprehension  Verbalized understanding;Returned demonstration       PT Short Term Goals - 12/28/19 1025      PT SHORT TERM GOAL #1   Title  independent with intial HEP    Time  4    Period  Weeks    Status  Achieved      PT SHORT TERM GOAL #2   Title  education on vaginal dryness and ways to moisturize the area for good vaginal health    Time  4    Period  Weeks    Status  Achieved       PT SHORT TERM GOAL #3   Title  pelvic pain decreased >/= 25% due to reduction of pelvic floor muscle spasms    Time  4    Period  Weeks    Status  Achieved        PT Long Term Goals - 12/28/19 1025      PT LONG TERM GOAL #1   Title  independent with advanced HEP    Time  8    Period  Weeks    Status  On-going      PT LONG TERM GOAL #2   Title  pelvic pain decreased to minimal to none after intercourse or resistive hip movements    Time  8    Period  Weeks    Status  On-going      PT LONG TERM GOAL #3   Title  pelvic strength increased to 4/5 holding for 10 seconds to improve support    Time  8    Period  Weeks    Status  On-going            Plan - 12/28/19 1054    Clinical Impression Statement  Patient reports her pain reduced to 1/10 after therapy. Patient understands how to use a tennis ball to massage her muscles. Patient reports her pain after intercourse is 80% better. Patient pelvis was in correct alignment after therapy. Patient has improve right hip ER. Patient will benefit from skilled therapy to reduce her pain and improve quality of life.    Personal Factors and Comorbidities  Comorbidity 3+;Sex    Comorbidities  uterine leiomyoma; Hypertension; abdominal hysterectomy 05/25/2013; vaginal deliveries 3x    Examination-Participation Restrictions  Interpersonal Relationship    Stability/Clinical Decision Making  Stable/Uncomplicated    Rehab Potential  Excellent    PT Frequency  1x / week    PT Duration  8 weeks    PT Treatment/Interventions  Biofeedback;Cryotherapy;Electrical Stimulation;Moist Heat;Neuromuscular re-education;Therapeutic exercise;Therapeutic activities;Patient/family education;Manual techniques    PT Next Visit Plan  work on hip abduction and IR, check pelvic alignment, work on [employer] stability    PT Home Exercise Plan  Access Code: [email or web address]  Consulted and Agree with Plan of Care  Patient       Patient will benefit from skilled therapeutic  intervention in order to improve the following deficits and impairments:  Decreased coordination, Increased fascial restricitons, Pain, Decreased strength  Visit Diagnosis: Muscle weakness (generalized)  Cramp and spasm     Problem List Patient Active Problem List   Diagnosis Date Noted  . Anemia, iron deficiency 02/04/2013  . Unspecified vitamin D deficiency 01/27/2013  . RECTAL BLEEDING 11/30/2008  . FIBROIDS, UTERUS 10/18/2008  . ASTHMA 10/18/2008  . ANEMIA 07/08/2008  . OBESITY 07/07/2008  . ELEVATED BLOOD PRESSURE 07/07/2008    Earlie Counts, PT 12/28/19 10:57 AM   [facility] [address], [place], [place], [address] Phone: [phone]   Fax:  [phone]  Name: Kaitlin Shannon MRN: [id number] Date of Birth: [birth year]

## 2019-12-30 DIAGNOSIS — R102 Pelvic and perineal pain: Secondary | ICD-10-CM | POA: Insufficient documentation

## 2020-01-04 ENCOUNTER — Ambulatory Visit: Payer: No Typology Code available for payment source | Attending: Certified Nurse Midwife | Admitting: Physical Therapy

## 2020-01-04 ENCOUNTER — Encounter: Payer: Self-pay | Admitting: Physical Therapy

## 2020-01-04 ENCOUNTER — Other Ambulatory Visit: Payer: Self-pay

## 2020-01-04 DIAGNOSIS — M6281 Muscle weakness (generalized): Secondary | ICD-10-CM | POA: Diagnosis present

## 2020-01-04 DIAGNOSIS — R252 Cramp and spasm: Secondary | ICD-10-CM | POA: Diagnosis present

## 2020-01-04 NOTE — Therapy (Signed)
[facility] [address], [place], [place], [address] Phone: [phone]   Fax:  [phone]  Physical Therapy Treatment  Patient Details  Name: Kaitlin Shannon MRN: [id number] Date of Birth: 1980-11-24 Referring Provider (PT): Dr. Linda Hedges   Encounter Date: 01/04/2020  PT End of Session - 01/04/20 1100    Visit Number  5    Date for PT Re-Evaluation  02/01/20    Authorization Type  UHC    PT Start Time  1022    PT Stop Time  1100    PT Time Calculation (min)  38 min    Activity Tolerance  Patient tolerated treatment well;No increased pain    Behavior During Therapy  WFL for tasks assessed/performed       Past Medical History:  Diagnosis Date  . Anemia   . Astigmatism   . Hypertension    no meds currently  . Incompetent cervix   . Unspecified vitamin D deficiency   . Uterine leiomyoma     Past Surgical History:  Procedure Laterality Date  . ABDOMINAL HYSTERECTOMY N/A 05/25/2013   Procedure: HYSTERECTOMY ABDOMINAL;  Surgeon: Emily Filbert, MD;  Location: [facility];  Service: Gynecology;  Laterality: N/A;  . BILATERAL SALPINGECTOMY Bilateral 05/25/2013   Procedure: BILATERAL SALPINGECTOMY;  Surgeon: Emily Filbert, MD;  Location: [facility];  Service: Gynecology;  Laterality: Bilateral;  . TUBAL LIGATION    . vaginal deliveries  2005,2007, 2009    There were no vitals filed for this visit.  Subjective Assessment - 01/04/20 1022    Subjective  I had some discomfort with the vaginal area. I had a cramp in the left thigh.    Patient Stated Goals  reduce pain    Currently in Pain?  Yes    Pain Score  3     Pain Location  Vagina    Pain Orientation  Mid    Pain Descriptors / Indicators  Spasm    Pain Type  Acute pain    Pain Onset  More than a month ago    Pain Frequency  Intermittent    Aggravating Factors   after intercourse like the following day    Pain Relieving Factors  heat    Multiple Pain Sites  No          [facility] PT Assessment - 01/04/20 0001      PROM   Right Hip External Rotation   25   after manual 60 degrees     Palpation   SI assessment   Asis are =; Right ASIS is shallow                   [facility] Adult PT Treatment/Exercise - 01/04/20 0001      Lumbar Exercises: Stretches   Press Ups  10 reps    Piriformis Stretch  Right;Left;1 rep;30 seconds    Piriformis Stretch Limitations  harder to do the right      Lumbar Exercises: Supine   Ab Set  10 reps;5 seconds    Bent Knee Raise  20 reps;1 second    Bent Knee Raise Limitations  10x each side    Bridge  20 reps    Bridge Limitations  push up highter with the left hip      Lumbar Exercises: Prone   Opposite Arm/Leg Raise  Right arm/Left leg;Left arm/Right leg;10 reps;1 second      Manual Therapy   Manual Therapy  Joint mobilization    Joint Mobilization  right hip with mulligan belt to improve hip ER with lateral glide, inferior glide, prone anterior glide of the right hip; distraction of righ thip with IR and hip adduction ; Pa mobilization to T4-L1 to reduce pelvic pain             PT Education - 01/04/20 1059    Education Details  Access Code: [email or web address]    Person(s) Educated  Patient    Methods  Explanation;Demonstration;Verbal cues;Handout    Comprehension  Returned demonstration;Verbalized understanding       PT Short Term Goals - 12/28/19 1025      PT SHORT TERM GOAL #1   Title  independent with intial HEP    Time  4    Period  Weeks    Status  Achieved      PT SHORT TERM GOAL #2   Title  education on vaginal dryness and ways to moisturize the area for good vaginal health    Time  4    Period  Weeks    Status  Achieved      PT SHORT TERM GOAL #3   Title  pelvic pain decreased >/= 25% due to reduction of pelvic floor muscle spasms    Time  4    Period  Weeks    Status  Achieved        PT Long Term Goals - 01/04/20 1025      PT LONG TERM GOAL #1   Title  independent with  advanced HEP    Time  8    Period  Weeks    Status  On-going      PT LONG TERM GOAL #2   Title  pelvic pain decreased to minimal to none after intercourse or resistive hip movements    Period  Weeks    Status  On-going      PT LONG TERM GOAL #3   Title  pelvic strength increased to 4/5 holding for 10 seconds to improve support    Time  8    Period  Weeks    Status  On-going            Plan - 01/04/20 1046    Clinical Impression Statement  After therapy patient had no pain and pelvis was in correct alignment. Patient is learning how to work on SI stability. Patient was able to wear jeans for the first time with minimal irritation. Patient saw the MD and she is progressing well. Patient right hip ER increased from 25 degrees to 60 degrees after manual work. Patient will benefit from skilled therapy to reduce her pain and improve quality of life.    Personal Factors and Comorbidities  Comorbidity 3+;Sex    Comorbidities  uterine leiomyoma; Hypertension; abdominal hysterectomy 05/25/2013; vaginal deliveries 3x    Examination-Participation Restrictions  Interpersonal Relationship    Stability/Clinical Decision Making  Stable/Uncomplicated    Rehab Potential  Excellent    PT Frequency  1x / week    PT Duration  8 weeks    PT Treatment/Interventions  Biofeedback;Cryotherapy;Electrical Stimulation;Moist Heat;Neuromuscular re-education;Therapeutic exercise;Therapeutic activities;Patient/family education;Manual techniques    PT Next Visit Plan  work on hip abduction and IR, check pelvic alignment, work on [employer] stability    PT Home Exercise Plan  Access Code: [email or web address] and Agree with Plan of Care  Patient       Patient will benefit from skilled therapeutic intervention in order  to improve the following deficits and impairments:  Decreased coordination, Increased fascial restricitons, Pain, Decreased strength  Visit Diagnosis: Muscle weakness (generalized)  Cramp and  spasm     Problem List Patient Active Problem List   Diagnosis Date Noted  . Anemia, iron deficiency 02/04/2013  . Unspecified vitamin D deficiency 01/27/2013  . RECTAL BLEEDING 11/30/2008  . FIBROIDS, UTERUS 10/18/2008  . ASTHMA 10/18/2008  . ANEMIA 07/08/2008  . OBESITY 07/07/2008  . ELEVATED BLOOD PRESSURE 07/07/2008    Earlie Counts, PT 01/04/20 11:04 AM   [facility] [address] [place], [place], [address] Phone: [phone]   Fax:  [phone]  Name: Kaitlin Shannon MRN: [id number] Date of Birth: 08-18-1981

## 2020-01-04 NOTE — Patient Instructions (Signed)
Access Code: [email or web address] URL: [email or web address] Date: 01/04/2020 Prepared by: Earlie Counts  Exercises Supine Figure 4 Piriformis Stretch - 1 x daily - 7 x weekly - 10 reps - 3 sets Supine Hamstring Stretch - 1 x daily - 7 x weekly - 1 sets - 1 reps - 30 sec hold Supine Lower Trunk Rotation - 1 x daily - 7 x weekly - 1 sets - 2 reps - 30 sec hold Supine Pelvic Floor Stretch - 1 x daily - 7 x weekly - 1 sets - 1 reps - 1-2 min hold Child's Pose Stretch - 1 x daily - 7 x weekly - 1 sets - 1 reps - 30 sec hold Quadruped Cat Camel - 1 x daily - 7 x weekly - 1 sets - 10 reps Supine Abdominal Wall Massage - 1 x daily - 7 x weekly - 1 reps - 3- 5 minutes hold V Sit Hip Adductor Hamstring Stretch - 1 x daily - 7 x weekly - 1 sets - 1 reps - 30 sec hold Supine Transversus Abdominis Bracing - Hands on Stomach - 1 x daily - 7 x weekly - 1 sets - 10 reps - 5 sec hold Supine March - 1 x daily - 7 x weekly - 1 sets - 10 reps Prone Alternating Arm and Leg Lifts - 1 x daily - 7 x weekly - 1 sets - 10 reps  [facility] [address] [place], [place] [address] Phone # [phone] Fax [phone]

## 2020-01-11 ENCOUNTER — Encounter: Payer: Self-pay | Admitting: Physical Therapy

## 2020-01-11 ENCOUNTER — Other Ambulatory Visit: Payer: Self-pay

## 2020-01-11 ENCOUNTER — Ambulatory Visit: Payer: No Typology Code available for payment source | Admitting: Physical Therapy

## 2020-01-11 DIAGNOSIS — M6281 Muscle weakness (generalized): Secondary | ICD-10-CM

## 2020-01-11 DIAGNOSIS — R252 Cramp and spasm: Secondary | ICD-10-CM

## 2020-01-11 NOTE — Patient Instructions (Signed)
Access Code: [email or web address] URL: [email or web address] Date: 01/11/2020 Prepared by: Earlie Counts  Exercises Supine Figure 4 Piriformis Stretch - 1 x daily - 7 x weekly - 10 reps - 3 sets Supine Hamstring Stretch - 1 x daily - 7 x weekly - 1 sets - 1 reps - 30 sec hold Supine Lower Trunk Rotation - 1 x daily - 7 x weekly - 1 sets - 2 reps - 30 sec hold Supine Pelvic Floor Stretch - 1 x daily - 7 x weekly - 1 sets - 1 reps - 1-2 min hold Child's Pose Stretch - 1 x daily - 7 x weekly - 1 sets - 1 reps - 30 sec hold Quadruped Cat Camel - 1 x daily - 7 x weekly - 1 sets - 10 reps Supine Abdominal Wall Massage - 1 x daily - 3 x weekly - 1 reps - 3- 5 minutes hold V Sit Hip Adductor Hamstring Stretch - 1 x daily - 7 x weekly - 1 sets - 1 reps - 30 sec hold Supine Transversus Abdominis Bracing - Hands on Stomach - 1 x daily - 3 x weekly - 1 sets - 10 reps - 5 sec hold Supine March - 1 x daily - 3 x weekly - 1 sets - 10 reps Prone Alternating Arm and Leg Lifts - 1 x daily - 3 x weekly - 1 sets - 10 reps [facility] [address], [place], [place] [address] Phone # [phone] Fax [phone]

## 2020-01-11 NOTE — Therapy (Signed)
[facility] [address], [place], [place], [address] Phone: [phone]   Fax:  [phone]  Physical Therapy Treatment  Patient Details  Name: Kaitlin Shannon MRN: [id number] Date of Birth: May 01, 1981 Referring Provider (PT): Dr. Linda Hedges   Encounter Date: 01/11/2020  PT End of Session - 01/11/20 1235    Visit Number  6    Date for PT Re-Evaluation  02/01/20    Authorization Type  UHC    PT Start Time  [id number]   came late   PT Stop Time  1055    PT Time Calculation (min)  32 min    Activity Tolerance  Patient tolerated treatment well;No increased pain    Behavior During Therapy  WFL for tasks assessed/performed       Past Medical History:  Diagnosis Date  . Anemia   . Astigmatism   . Hypertension    no meds currently  . Incompetent cervix   . Unspecified vitamin D deficiency   . Uterine leiomyoma     Past Surgical History:  Procedure Laterality Date  . ABDOMINAL HYSTERECTOMY N/A 05/25/2013   Procedure: HYSTERECTOMY ABDOMINAL;  Surgeon: Emily Filbert, MD;  Location: [facility];  Service: Gynecology;  Laterality: N/A;  . BILATERAL SALPINGECTOMY Bilateral 05/25/2013   Procedure: BILATERAL SALPINGECTOMY;  Surgeon: Emily Filbert, MD;  Location: [facility];  Service: Gynecology;  Laterality: Bilateral;  . TUBAL LIGATION    . vaginal deliveries  2005,2007, 2009    There were no vitals filed for this visit.  Subjective Assessment - 01/11/20 1023    Subjective  I had spasms in the left buttocks and used my tennis ball to massage the muscle. When doing the hip adduction increased pain.    Patient Stated Goals  reduce pain    Currently in Pain?  Yes    Pain Score  5     Pain Location  Vagina    Pain Orientation  Mid;Right    Pain Descriptors / Indicators  Spasm    Pain Type  Acute pain    Pain Onset  More than a month ago    Pain Frequency  Intermittent    Aggravating Factors   after intercourse like the following  day, hip adduction in sidely    Pain Relieving Factors  heat         [facility] PT Assessment - 01/11/20 0001      PROM   Right Hip External Rotation   60    Left Hip External Rotation   60      Palpation   SI assessment   ASIS are equal                   [facility] Adult PT Treatment/Exercise - 01/11/20 0001      Lumbar Exercises: Stretches   Active Hamstring Stretch  Right;Left;1 rep;30 seconds    Active Hamstring Stretch Limitations  supine     Lower Trunk Rotation  2 reps;30 seconds    Lower Trunk Rotation Limitations  left and right    Quadruped Mid Back Stretch  1 rep;30 seconds    Quadruped Mid Back Stretch Limitations  childs pose    Piriformis Stretch  Right;Left;1 rep;30 seconds    Piriformis Stretch Limitations  supine      Lumbar Exercises: Prone   Opposite Arm/Leg Raise  Right arm/Left leg;Left arm/Right leg;10 reps;1 second    Opposite Arm/Leg Raise Limitations  keeping trunk leveled  Other Prone Lumbar Exercises  prone hip ER/IR working on range      Lumbar Exercises: Quadruped   [place] Horse  10 reps      Manual Therapy   Manual Therapy  Joint mobilization    Manual therapy comments  Stretched right hip in all directions; stretched bil. hip flexors    Joint Mobilization  distraction of the right hip             PT Education - 01/11/20 1059    Education Details  Access Code: [email or web address]    Person(s) Educated  Patient    Methods  Explanation;Demonstration;Handout    Comprehension  Verbalized understanding;Returned demonstration       PT Short Term Goals - 12/28/19 1025      PT SHORT TERM GOAL #1   Title  independent with intial HEP    Time  4    Period  Weeks    Status  Achieved      PT SHORT TERM GOAL #2   Title  education on vaginal dryness and ways to moisturize the area for good vaginal health    Time  4    Period  Weeks    Status  Achieved      PT SHORT TERM GOAL #3   Title  pelvic pain decreased >/= 25% due to reduction  of pelvic floor muscle spasms    Time  4    Period  Weeks    Status  Achieved        PT Long Term Goals - 01/04/20 1025      PT LONG TERM GOAL #1   Title  independent with advanced HEP    Time  8    Period  Weeks    Status  On-going      PT LONG TERM GOAL #2   Title  pelvic pain decreased to minimal to none after intercourse or resistive hip movements    Period  Weeks    Status  On-going      PT LONG TERM GOAL #3   Title  pelvic strength increased to 4/5 holding for 10 seconds to improve support    Time  8    Period  Weeks    Status  On-going            Plan - 01/11/20 1027    Clinical Impression Statement  Patient reports her pain is 55% better. Patient was instructed today on what exercises to do for stretches daily and strength 3 times per week. Patient contines to have 60 degrees of hip rotation. Patient contiues to have spasms after intercourse but they are not as bad. Patient needed verbal cues to contract her core with prone lift extremites due to lifting her hips up. Patient will benefit from skilled therapy to reduce her pain and improve quality of life.    Personal Factors and Comorbidities  Comorbidity 3+;Sex    Comorbidities  uterine leiomyoma; Hypertension; abdominal hysterectomy 05/25/2013; vaginal deliveries 3x    Examination-Participation Restrictions  Interpersonal Relationship    Stability/Clinical Decision Making  Stable/Uncomplicated    Rehab Potential  Excellent    PT Frequency  1x / week    PT Duration  8 weeks    PT Treatment/Interventions  Biofeedback;Cryotherapy;Electrical Stimulation;Moist Heat;Neuromuscular re-education;Therapeutic exercise;Therapeutic activities;Patient/family education;Manual techniques    PT Next Visit Plan  work on internal left pelvic floor; hip ER stretch; hip abduction strength and hip adduction    PT Home Exercise Plan  Access Code: [email or web address]    Consulted and Agree with Plan of Care  Patient       Patient will benefit  from skilled therapeutic intervention in order to improve the following deficits and impairments:  Decreased coordination, Increased fascial restricitons, Pain, Decreased strength  Visit Diagnosis: Muscle weakness (generalized)  Cramp and spasm     Problem List Patient Active Problem List   Diagnosis Date Noted  . Anemia, iron deficiency 02/04/2013  . Unspecified vitamin D deficiency 01/27/2013  . RECTAL BLEEDING 11/30/2008  . FIBROIDS, UTERUS 10/18/2008  . ASTHMA 10/18/2008  . ANEMIA 07/08/2008  . OBESITY 07/07/2008  . ELEVATED BLOOD PRESSURE 07/07/2008    Earlie Counts, PT 01/11/20 12:42 PM   [facility] [address], [place], [place], [address] Phone: [phone]   Fax:  [phone]  Name: Kaitlin Shannon MRN: [id number] Date of Birth: 01/12/1981

## 2020-01-27 ENCOUNTER — Ambulatory Visit: Payer: Self-pay | Admitting: Physical Therapy

## 2020-02-14 ENCOUNTER — Encounter: Payer: Self-pay | Admitting: Physical Therapy

## 2020-02-14 ENCOUNTER — Other Ambulatory Visit: Payer: Self-pay

## 2020-02-14 ENCOUNTER — Ambulatory Visit: Payer: No Typology Code available for payment source | Attending: Certified Nurse Midwife | Admitting: Physical Therapy

## 2020-02-14 DIAGNOSIS — R252 Cramp and spasm: Secondary | ICD-10-CM | POA: Diagnosis present

## 2020-02-14 DIAGNOSIS — M6281 Muscle weakness (generalized): Secondary | ICD-10-CM | POA: Diagnosis present

## 2020-02-14 NOTE — Therapy (Addendum)
[facility] [address] [place], [place], [address] Phone: [phone]   Fax:  [phone]  Physical Therapy Treatment  Patient Details  Name: Kaitlin Shannon MRN: [id number] Date of Birth: 09/29/1981 Referring Provider (PT): Dr. Linda Hedges   Encounter Date: 02/14/2020  PT End of Session - 02/14/20 0943    Visit Number  7    Date for PT Re-Evaluation  04/10/20    Authorization Type  UHC    PT Start Time  0940   came late   PT Stop Time  1010    PT Time Calculation (min)  30 min    Activity Tolerance  Patient tolerated treatment well;No increased pain    Behavior During Therapy  WFL for tasks assessed/performed       Past Medical History:  Diagnosis Date  . Anemia   . Astigmatism   . Hypertension    no meds currently  . Incompetent cervix   . Unspecified vitamin D deficiency   . Uterine leiomyoma     Past Surgical History:  Procedure Laterality Date  . ABDOMINAL HYSTERECTOMY N/A 05/25/2013   Procedure: HYSTERECTOMY ABDOMINAL;  Surgeon: Emily Filbert, MD;  Location: [facility];  Service: Gynecology;  Laterality: N/A;  . BILATERAL SALPINGECTOMY Bilateral 05/25/2013   Procedure: BILATERAL SALPINGECTOMY;  Surgeon: Emily Filbert, MD;  Location: [facility];  Service: Gynecology;  Laterality: Bilateral;  . TUBAL LIGATION    . vaginal deliveries  2005,2007, 2009    There were no vitals filed for this visit.  Subjective Assessment - 02/14/20 0943    Subjective  I have not had pain. I have had spasms and irritation. The spasms are random. I try to keep my body calm to allow my body to relax. I am not sure on what to do for the spasms.    Patient Stated Goals  reduce pain    Currently in Pain?  Yes    Pain Score  3     Pain Location  Vagina    Pain Orientation  Mid    Pain Descriptors / Indicators  Spasm    Pain Type  Acute pain    Pain Onset  More than a month ago    Pain Frequency  Intermittent    Aggravating  Factors   random,    Pain Relieving Factors  heat    Multiple Pain Sites  No         [facility] PT Assessment - 02/14/20 0001      Assessment   Medical Diagnosis  R10.2 Pelvic pain in female    Referring Provider (PT)  Dr. Linda Hedges    Onset Date/Surgical Date  10/01/19    Prior Therapy  none      Precautions   Precautions  None      Restrictions   Weight Bearing Restrictions  No      [place] residence      Prior Function   Level of Independence  Independent      Cognition   Overall Cognitive Status  Within Functional Limits for tasks assessed      Posture/Postural Control   Posture/Postural Control  No significant limitations      ROM / Strength   AROM / PROM / Strength  AROM;PROM;Strength      PROM   Right Hip External Rotation   70    Left Hip External Rotation   70  Strength   Right Hip ABduction  4+/5    Right Hip ADduction  5/5    Left Hip Extension  5/5    Left Hip ABduction  5/5    Left Hip ADduction  5/5                 Pelvic Floor Special Questions - 02/14/20 0001    Pelvic Floor Internal Exam  Patient confirms identification and approves PT to assess pelvic floor and treatment    Exam Type  Vaginal    Palpation  trouble letting the muscle relax, tender on the left levator ani    Strength  fair squeeze, definite lift    Strength # of seconds  4        [facility] Adult PT Treatment/Exercise - 02/14/20 0001      Neuro Re-ed    Neuro Re-ed Details   breathing into the lower rib cage then into the abdomen to expand the pelvic floor to reduce a spasms, needed tactile cues to not raise the chest      Lumbar Exercises: Stretches   Other Lumbar Stretch Exercise  stretch the pelvic floor in squatting in standing or laying on back with feet against the wall      Manual Therapy   Manual Therapy  Internal Pelvic Floor    Internal Pelvic Floor  soft tissue work to the left levator ani and educated  pateint on how to perfrom soft tissue work to the left levator at home             PT Education - 02/14/20 1010    Education Details  Access Code: [email or web address]    Person(s) Educated  Patient    Methods  Explanation;Demonstration;Verbal cues;Handout    Comprehension  Returned demonstration;Verbalized understanding       PT Short Term Goals - 02/14/20 0946      PT SHORT TERM GOAL #1   Title  independent with intial HEP    Time  4    Period  Weeks    Status  Achieved    Target Date  01/04/20      PT SHORT TERM GOAL #2   Title  education on vaginal dryness and ways to moisturize the area for good vaginal health    Time  4    Period  Weeks    Status  Achieved      PT SHORT TERM GOAL #3   Title  pelvic pain decreased >/= 25% due to reduction of pelvic floor muscle spasms    Period  Weeks    Status  Achieved    Target Date  01/04/20        PT Long Term Goals - 02/14/20 0946      PT LONG TERM GOAL #1   Title  independent with advanced HEP    Time  8    Period  Weeks    Status  On-going      PT LONG TERM GOAL #2   Title  pelvic pain decreased to minimal to none after intercourse or resistive hip movements    Time  8    Period  Weeks    Status  On-going      PT LONG TERM GOAL #3   Title  pelvic strength increased to 4/5 holding for 10 seconds to improve support    Time  8    Status  On-going            Plan -  02/14/20 1017    Clinical Impression Statement  Patient is not having pain several days after intercourse. She is having random pelvic floor spasms. Patient reports the spasms are 80% better. Pelvic floor strength is 3/5 holding for 4 seconds. Patient has some difficulty with relaxing her pelvic floor after contraction. Tenderness located in the left levator ani. Patient has increased strength of the hips. Patient has learned how to breath and stretch pelvic floor for relaxation. Patient will benefit from skilled therapy to improve pelvic floor relaxation  and coordination.    Personal Factors and Comorbidities  Comorbidity 3+;Sex    Comorbidities  uterine leiomyoma; Hypertension; abdominal hysterectomy 05/25/2013; vaginal deliveries 3x    Examination-Participation Restrictions  Interpersonal Relationship    Stability/Clinical Decision Making  Stable/Uncomplicated    Rehab Potential  Excellent    PT Frequency  1x / week    PT Duration  8 weeks    PT Treatment/Interventions  Biofeedback;Cryotherapy;Electrical Stimulation;Moist Heat;Neuromuscular re-education;Therapeutic exercise;Therapeutic activities;Patient/family education;Manual techniques    PT Next Visit Plan  work on internal left pelvic floor; reverse clam, breathing    PT Home Exercise Plan  Access Code: [email or web address]    Consulted and Agree with Plan of Care  Patient       Patient will benefit from skilled therapeutic intervention in order to improve the following deficits and impairments:  Decreased coordination, Increased fascial restricitons, Pain, Decreased strength  Visit Diagnosis: Muscle weakness (generalized) - Plan: PT plan of care cert/re-cert  Cramp and spasm - Plan: PT plan of care cert/re-cert     Problem List Patient Active Problem List   Diagnosis Date Noted  . Anemia, iron deficiency 02/04/2013  . Unspecified vitamin D deficiency 01/27/2013  . RECTAL BLEEDING 11/30/2008  . FIBROIDS, UTERUS 10/18/2008  . ASTHMA 10/18/2008  . ANEMIA 07/08/2008  . OBESITY 07/07/2008  . ELEVATED BLOOD PRESSURE 07/07/2008    Earlie Counts, PT 02/14/20 10:17 AM   [facility] [address] [place], [place], [address] Phone: [phone]   Fax:  [phone]  Name: Kaitlin Shannon MRN: [id number] Date of Birth: 1981/08/27 PHYSICAL THERAPY DISCHARGE SUMMARY  Visits from Start of Care: 7  Current functional level related to goals / functional outcomes: See above. Patient last visit was on 02/14/2020. She has not  returned since then.    Remaining deficits: See above.    Education / Equipment: HEP Plan:                                                    Patient goals were not met. Patient is being discharged due to not returning since the last visit. Thank you for the referral. Earlie Counts, PT 04/11/20 1:37 PM    ?????

## 2020-02-14 NOTE — Patient Instructions (Signed)
Access Code: [email or web address] URL: [email or web address] Date: 01/11/2020 Prepared by: Earlie Counts  Exercises Supine Figure 4 Piriformis Stretch - 1 x daily - 7 x weekly - 10 reps - 3 sets Supine Hamstring Stretch - 1 x daily - 7 x weekly - 1 sets - 1 reps - 30 sec hold Supine Lower Trunk Rotation - 1 x daily - 7 x weekly - 1 sets - 2 reps - 30 sec hold Supine Pelvic Floor Stretch - 1 x daily - 7 x weekly - 1 sets - 1 reps - 1-2 min hold Child's Pose Stretch - 1 x daily - 7 x weekly - 1 sets - 1 reps - 30 sec hold Quadruped Cat Camel - 1 x daily - 7 x weekly - 1 sets - 10 reps Supine Abdominal Wall Massage - 1 x daily - 3 x weekly - 1 reps - 3- 5 minutes hold V Sit Hip Adductor Hamstring Stretch - 1 x daily - 7 x weekly - 1 sets - 1 reps - 30 sec hold Supine Transversus Abdominis Bracing - Hands on Stomach - 1 x daily - 3 x weekly - 1 sets - 10 reps - 5 sec hold Supine March - 1 x daily - 3 x weekly - 1 sets - 10 reps Prone Alternating Arm and Leg Lifts - 1 x daily - 3 x weekly - 1 sets - 10 reps Seated Diaphragmatic Breathing - 1 x daily - 7 x weekly - 1 sets - 10 reps Deep Squat with Pelvic Floor Relaxation - 1 x daily - 7 x weekly - 1 sets - 1 reps - 30 sec hold [facility] [address], [place], [place] [address] Phone # [phone] Fax [phone]

## 2020-10-18 DIAGNOSIS — B9689 Other specified bacterial agents as the cause of diseases classified elsewhere: Secondary | ICD-10-CM | POA: Diagnosis not present

## 2020-10-18 DIAGNOSIS — N760 Acute vaginitis: Secondary | ICD-10-CM | POA: Diagnosis not present

## 2020-10-18 DIAGNOSIS — N898 Other specified noninflammatory disorders of vagina: Secondary | ICD-10-CM | POA: Diagnosis not present

## 2020-10-18 DIAGNOSIS — R102 Pelvic and perineal pain: Secondary | ICD-10-CM | POA: Diagnosis not present

## 2020-12-13 DIAGNOSIS — N898 Other specified noninflammatory disorders of vagina: Secondary | ICD-10-CM | POA: Diagnosis not present

## 2021-02-01 DIAGNOSIS — Z20822 Contact with and (suspected) exposure to covid-19: Secondary | ICD-10-CM | POA: Diagnosis not present

## 2021-02-02 DIAGNOSIS — U071 COVID-19: Secondary | ICD-10-CM | POA: Diagnosis not present

## 2021-02-02 DIAGNOSIS — Z5321 Procedure and treatment not carried out due to patient leaving prior to being seen by health care provider: Secondary | ICD-10-CM | POA: Insufficient documentation

## 2021-02-02 DIAGNOSIS — R0602 Shortness of breath: Secondary | ICD-10-CM | POA: Diagnosis not present

## 2021-02-03 ENCOUNTER — Encounter (HOSPITAL_COMMUNITY): Payer: Self-pay | Admitting: Emergency Medicine

## 2021-02-03 ENCOUNTER — Emergency Department (HOSPITAL_COMMUNITY)
Admission: EM | Admit: 2021-02-03 | Discharge: 2021-02-03 | Disposition: A | Discharge status: Home / Self Care | Payer: BC Managed Care – PPO | Attending: Emergency Medicine | Admitting: Emergency Medicine

## 2021-02-03 ENCOUNTER — Other Ambulatory Visit: Payer: Self-pay

## 2021-02-03 LAB — CBC WITH DIFFERENTIAL/PLATELET
Abs Immature Granulocytes: 0 10*3/uL (ref 0.00–0.07)
Basophils Absolute: 0 10*3/uL (ref 0.0–0.1)
Basophils Relative: 1 %
Eosinophils Absolute: 0 10*3/uL (ref 0.0–0.5)
Eosinophils Relative: 1 %
HCT: 40.1 % (ref 36.0–46.0)
Hemoglobin: 12.9 g/dL (ref 12.0–15.0)
Immature Granulocytes: 0 %
Lymphocytes Relative: 57 %
Lymphs Abs: 1.5 10*3/uL (ref 0.7–4.0)
MCH: 27.2 pg (ref 26.0–34.0)
MCHC: 32.2 g/dL (ref 30.0–36.0)
MCV: 84.6 fL (ref 80.0–100.0)
Monocytes Absolute: 0.5 10*3/uL (ref 0.1–1.0)
Monocytes Relative: 20 %
Neutro Abs: 0.6 10*3/uL — ABNORMAL LOW (ref 1.7–7.7)
Neutrophils Relative %: 21 %
Platelets: 185 10*3/uL (ref 150–400)
RBC: 4.74 MIL/uL (ref 3.87–5.11)
RDW: 14.6 % (ref 11.5–15.5)
WBC: 2.7 10*3/uL — ABNORMAL LOW (ref 4.0–10.5)
nRBC: 0 % (ref 0.0–0.2)

## 2021-02-03 LAB — COMPREHENSIVE METABOLIC PANEL
ALT: 19 U/L (ref 0–44)
AST: 28 U/L (ref 15–41)
Albumin: 3.8 g/dL (ref 3.5–5.0)
Alkaline Phosphatase: 25 U/L — ABNORMAL LOW (ref 38–126)
Anion gap: 7 (ref 5–15)
BUN: 8 mg/dL (ref 6–20)
CO2: 28 mmol/L (ref 22–32)
Calcium: 8.9 mg/dL (ref 8.9–10.3)
Chloride: 101 mmol/L (ref 98–111)
Creatinine, Ser: 0.73 mg/dL (ref 0.44–1.00)
GFR, Estimated: >60 mL/min (ref >60)
Glucose, Bld: 115 mg/dL — ABNORMAL HIGH (ref 70–99)
Potassium: 3.8 mmol/L (ref 3.5–5.1)
Sodium: 136 mmol/L (ref 135–145)
Total Bilirubin: 0.4 mg/dL (ref 0.3–1.2)
Total Protein: 6.6 g/dL (ref 6.5–8.1)

## 2021-02-03 NOTE — ED Notes (Signed)
Pt said all she needed was blood work and she will read mychart. Pt left AMA.

## 2021-02-03 NOTE — ED Triage Notes (Signed)
Patient tested positive for Covid19 today , requesting blood test so she can be prescribed with oral Covid medication , mild SOB with fatigue .

## 2021-02-05 LAB — PATHOLOGIST SMEAR REVIEW

## 2021-07-29 DIAGNOSIS — I10 Essential (primary) hypertension: Secondary | ICD-10-CM | POA: Insufficient documentation

## 2021-10-02 DIAGNOSIS — M654 Radial styloid tenosynovitis [de Quervain]: Secondary | ICD-10-CM | POA: Insufficient documentation

## 2021-10-11 ENCOUNTER — Other Ambulatory Visit: Payer: Self-pay

## 2021-10-11 ENCOUNTER — Emergency Department: Payer: BC Managed Care – PPO

## 2021-10-11 ENCOUNTER — Encounter: Payer: Self-pay | Admitting: *Deleted

## 2021-10-11 ENCOUNTER — Emergency Department
Admission: EM | Admit: 2021-10-11 | Discharge: 2021-10-11 | Disposition: A | Discharge status: Home / Self Care | Payer: BC Managed Care – PPO | Attending: Emergency Medicine | Admitting: Emergency Medicine

## 2021-10-11 DIAGNOSIS — S8011XA Contusion of right lower leg, initial encounter: Secondary | ICD-10-CM | POA: Diagnosis not present

## 2021-10-11 DIAGNOSIS — S6991XA Unspecified injury of right wrist, hand and finger(s), initial encounter: Secondary | ICD-10-CM | POA: Diagnosis present

## 2021-10-11 DIAGNOSIS — Y92410 Unspecified street and highway as the place of occurrence of the external cause: Secondary | ICD-10-CM | POA: Diagnosis not present

## 2021-10-11 DIAGNOSIS — S60221A Contusion of right hand, initial encounter: Secondary | ICD-10-CM | POA: Diagnosis not present

## 2021-10-11 MED ORDER — METHOCARBAMOL 500 MG PO TABS: 500.0000 mg | ORAL_TABLET | Freq: Four times a day (QID) | ORAL | 0 refills | Status: DC

## 2021-10-11 MED ORDER — MELOXICAM 15 MG PO TABS: 15.0000 mg | ORAL_TABLET | Freq: Every day | ORAL | 2 refills | Status: AC
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
## 2021-10-12 DIAGNOSIS — M79641 Pain in right hand: Secondary | ICD-10-CM | POA: Insufficient documentation

## 2021-12-06 DIAGNOSIS — M545 Low back pain, unspecified: Secondary | ICD-10-CM | POA: Insufficient documentation

## 2021-12-06 DIAGNOSIS — Q6102 Congenital multiple renal cysts: Secondary | ICD-10-CM | POA: Insufficient documentation

## 2021-12-06 DIAGNOSIS — F430 Acute stress reaction: Secondary | ICD-10-CM | POA: Insufficient documentation

## 2021-12-06 DIAGNOSIS — M542 Cervicalgia: Secondary | ICD-10-CM | POA: Insufficient documentation

## 2022-09-13 ENCOUNTER — Other Ambulatory Visit: Payer: Self-pay | Admitting: Medical

## 2022-09-13 MED ORDER — OSELTAMIVIR PHOSPHATE 75 MG PO CAPS: 75.0000 mg | ORAL_CAPSULE | Freq: Every day | ORAL | 0 refills | Status: AC

## 2024-03-11 ENCOUNTER — Ambulatory Visit
Admission: EM | Admit: 2024-03-11 | Discharge: 2024-03-11 | Disposition: A | Discharge status: Home / Self Care | Payer: Self-pay

## 2024-03-11 DIAGNOSIS — M5030 Other cervical disc degeneration, unspecified cervical region: Secondary | ICD-10-CM | POA: Insufficient documentation

## 2024-03-11 DIAGNOSIS — M7918 Myalgia, other site: Secondary | ICD-10-CM | POA: Insufficient documentation

## 2024-03-11 DIAGNOSIS — M51369 Other intervertebral disc degeneration, lumbar region without mention of lumbar back pain or lower extremity pain: Secondary | ICD-10-CM | POA: Insufficient documentation

## 2024-03-11 DIAGNOSIS — M533 Sacrococcygeal disorders, not elsewhere classified: Secondary | ICD-10-CM | POA: Insufficient documentation

## 2024-03-11 DIAGNOSIS — H1012 Acute atopic conjunctivitis, left eye: Secondary | ICD-10-CM

## 2024-03-11 MED ORDER — OLOPATADINE HCL 0.2 % OP SOLN: 1.0000 [drp] | Freq: Every day | OPHTHALMIC | 1 refills | Status: DC

## 2024-03-11 MED ORDER — TOBRAMYCIN-DEXAMETHASONE 0.3-0.1 % OP SUSP: OPHTHALMIC | 0 refills | Status: DC

## 2024-03-11 MED ORDER — CETIRIZINE HCL 10 MG PO TABS: 10.0000 mg | ORAL_TABLET | Freq: Every day | ORAL | 1 refills | Status: DC

## 2024-03-11 NOTE — Discharge Instructions (Addendum)
 Please begin TobraDex eyedrops by placing 1 to 2 drops in the corner of your left eye, the rest to your nose, every 2 hours while you are awake for the next 2 days.  After that, you please instill 1 to 2 drops in the corner of your left eye every 4-6 hours for the next 5 days.  In addition to TobraDex, please instill 1 drop of Pataday into both eyes daily.  Pataday is a topical antihistamine which should help dry up the excessive tear production and reduce allergic reaction.  I have also provided you with a prescription for Zyrtec that I would like for you to begin taking daily as well.  This will help reduce inflammation caused by allergies.  If you have not had meaningful improvement your symptoms or if your eye pain, drainage, swelling becomes worse despite using TobraDex, please seek emergency medical treatment.  You can either reach out to your eye doctor or go to the emergency room.  Here at urgent care we are not able to perform detailed eye exams.  Thank you for visiting [facility] today.

## 2024-03-11 NOTE — ED Triage Notes (Addendum)
 Pt state she started having left eye pain, left eye redness, and left eye drainage/crust in the past two hours.

## 2024-03-11 NOTE — ED Provider Notes (Signed)
 [facility]    CSN: [id number] Arrival date & time: 03/11/24  1137    HISTORY   Chief Complaint  Patient presents with   Eye Drainage   Eye Pain   HPI Kaitlin Shannon is a pleasant, 43 y.o. female who presents to urgent care today. Pt state she started having burning, redness and clear drainage from left eye that began about 2 hours ago.  Patient denies trauma to left eye, possible presence of foreign body, vision changes.  Patient endorses a history of allergies, not currently taking allergy medications.  The history is provided by the patient.  Eye Pain This is a new problem. The current episode started 1 to 2 hours ago.   Past Medical History:  Diagnosis Date   Anemia    Astigmatism    Hypertension    no meds currently   Incompetent cervix    Unspecified vitamin D  deficiency    Uterine leiomyoma    Patient Active Problem List   Diagnosis Date Noted   Bulging lumbar disc 03/11/2024   DDD (degenerative disc disease), cervical 03/11/2024   Myofascial pain 03/11/2024   Pain of both sacroiliac joints 03/11/2024   Acute reaction to situational stress 12/06/2021   Low back pain 12/06/2021   Multiple renal cysts 12/06/2021   Neck pain 12/06/2021   Pain in right hand 10/12/2021   Tenosynovitis, de Quervain 10/02/2021   Hypertension 07/29/2021   Pelvic pain in female 12/30/2019   Anemia, iron deficiency 02/04/2013   Vitamin D  deficiency 01/27/2013   RECTAL BLEEDING 11/30/2008   FIBROIDS, UTERUS 10/18/2008   Asthma 10/18/2008   ANEMIA 07/08/2008   OBESITY 07/07/2008   ELEVATED BLOOD PRESSURE 07/07/2008   Past Surgical History:  Procedure Laterality Date   ABDOMINAL HYSTERECTOMY N/A 05/25/2013   Procedure: HYSTERECTOMY ABDOMINAL;  Surgeon: Ana Balling, MD;  Location: [facility];  Service: Gynecology;  Laterality: N/A;   BILATERAL SALPINGECTOMY Bilateral 05/25/2013   Procedure: BILATERAL SALPINGECTOMY;  Surgeon: Ana Balling, MD;  Location: [facility];  Service:  Gynecology;  Laterality: Bilateral;   TUBAL LIGATION     vaginal deliveries  2005,2007, 2009   OB History     Gravida  5   Para  3   Term  1   Preterm  2   AB  2   Living  3      SAB  2   IAB      Ectopic      Multiple      Live Births             Home Medications    Prior to Admission medications   Medication Sig Start Date End Date Taking? Authorizing Provider  amLODipine (NORVASC) 10 MG tablet Take 1 tablet by mouth daily. 09/03/22  Yes [provider]  cetirizine (ZYRTEC ALLERGY) 10 MG tablet Take 1 tablet (10 mg total) by mouth at bedtime. 03/11/24 09/07/24 Yes Eloise Hake Scales, PA-C  Olopatadine HCl (PATADAY) 0.2 % SOLN Apply 1 drop to eye daily. 03/11/24  Yes Eloise Hake Scales, PA-C  tobramycin-dexamethasone  [facility]) ophthalmic solution Instill 1 to 2 drops in the corner of your left eye nearest to your nose every 2 hours for the next 2 days, then decrease to every 4-6 hours for the next 5 days. 03/11/24  Yes Eloise Hake Scales, PA-C    Family History Family History  Problem Relation Age of Onset   COPD Mother    Seizures Mother  Heart disease Father        died of cardiac arrest   Other Father        bowel infection   Cancer Sister        lung   Cancer Paternal Grandfather        lung   Diabetes Neg Hx    Colon cancer Neg Hx    Cancer Sister        lung   Cancer Maternal Grandmother        breast cancer   Breast cancer Maternal Grandmother    Social History Social History   Tobacco Use   Smoking status: Never    Passive exposure: Never   Smokeless tobacco: Never  Vaping Use   Vaping status: Never Used  Substance Use Topics   Alcohol use: Yes    Comment: once or twice a year   Drug use: No   Allergies   Patient has no known allergies.  Review of Systems Review of Systems  Eyes:  Positive for pain.   Pertinent findings revealed after performing a 14 point review of systems has been noted in the history  of present illness.  Physical Exam Vital Signs BP (!) 157/95 (BP Location: Right Arm)   Pulse (!) 57   Temp 98 F (36.7 C) (Oral)   Resp 18   LMP 04/27/2013   SpO2 99%   No data found.  Physical Exam Vitals and nursing note reviewed.  Constitutional:      General: She is not in acute distress.    Appearance: Normal appearance. She is not ill-appearing.  HENT:     Head: Normocephalic and atraumatic.     Salivary Glands: Right salivary gland is not diffusely enlarged or tender. Left salivary gland is not diffusely enlarged or tender.     Right Ear: Ear canal and external ear normal. No drainage. A middle ear effusion is present. There is no impacted cerumen. Tympanic membrane is bulging. Tympanic membrane is not injected or erythematous.     Left Ear: Ear canal and external ear normal. No drainage. A middle ear effusion is present. There is no impacted cerumen. Tympanic membrane is bulging. Tympanic membrane is not injected or erythematous.     Ears:     Comments: Bilateral EACs normal, both TMs bulging with clear fluid    Nose: Rhinorrhea present. No nasal deformity, septal deviation, signs of injury, nasal tenderness, mucosal edema or congestion. Rhinorrhea is clear.     Right Nostril: Occlusion present. No foreign body, epistaxis or septal hematoma.     Left Nostril: Occlusion present. No foreign body, epistaxis or septal hematoma.     Right Turbinates: Enlarged, swollen and pale.     Left Turbinates: Enlarged, swollen and pale.     Right Sinus: No maxillary sinus tenderness or frontal sinus tenderness.     Left Sinus: No maxillary sinus tenderness or frontal sinus tenderness.     Mouth/Throat:     Lips: Pink. No lesions.     Mouth: Mucous membranes are moist. No oral lesions.     Pharynx: Oropharynx is clear. Uvula midline. No posterior oropharyngeal erythema or uvula swelling.     Tonsils: No tonsillar exudate. 0 on the right. 0 on the left.     Comments: Postnasal  drip  Eyes:     General: Lids are normal.        Right eye: No discharge.        Left eye: No discharge.  Extraocular Movements: Extraocular movements intact.     Conjunctiva/sclera:     Right eye: Right conjunctiva is not injected. No chemosis, exudate or hemorrhage.    Left eye: Left conjunctiva is injected. Chemosis present. No exudate or hemorrhage.  Neck:     Trachea: Trachea and phonation normal.   Cardiovascular:     Rate and Rhythm: Normal rate and regular rhythm.     Pulses: Normal pulses.     Heart sounds: Normal heart sounds. No murmur heard.    No friction rub. No gallop.  Pulmonary:     Effort: Pulmonary effort is normal. No accessory muscle usage, prolonged expiration or respiratory distress.     Breath sounds: Normal breath sounds. No stridor, decreased air movement or transmitted upper airway sounds. No decreased breath sounds, wheezing, rhonchi or rales.  Chest:     Chest wall: No tenderness.   Musculoskeletal:        General: Normal range of motion.     Cervical back: Normal range of motion and neck supple. Normal range of motion.  Lymphadenopathy:     Cervical: No cervical adenopathy.   Skin:    General: Skin is warm and dry.     Findings: No erythema or rash.   Neurological:     General: No focal deficit present.     Mental Status: She is alert and oriented to person, place, and time.   Psychiatric:        Mood and Affect: Mood normal.        Behavior: Behavior normal.     Visual Acuity Right Eye Distance:   Left Eye Distance:   Bilateral Distance:    Right Eye Near:   Left Eye Near:    Bilateral Near:     UC Couse / Diagnostics / Procedures:     Radiology No results found.  Procedures Procedures (including critical care time) EKG  Pending results:  Labs Reviewed - No data to display  Medications Ordered in UC: Medications - No data to display  UC Diagnoses / Final Clinical Impressions(s)   I have reviewed the triage vital  signs and the nursing notes.  Pertinent labs & imaging results that were available during my care of the patient were reviewed by me and considered in my medical decision making (see chart for details).    Final diagnoses:  Allergic conjunctivitis of left eye   Patient advised physical exam findings concerning for allergic conjunctivitis.  Because of rapid onset of symptoms as well as patient's complaint of pain, I provided patient with a prescription for TobraDex for coverage of bacterial etiology and to reduce pain in addition to Zyrtec and Pataday for treatment of the allergic conjunctivitis.  Conservative care recommended.  Return precautions advised.  Please see discharge instructions below for details of plan of care as provided to patient. ED Prescriptions     Medication Sig Dispense Auth. Provider   cetirizine (ZYRTEC ALLERGY) 10 MG tablet Take 1 tablet (10 mg total) by mouth at bedtime. 90 tablet Eloise Hake Scales, PA-C   tobramycin-dexamethasone  [facility]) ophthalmic solution Instill 1 to 2 drops in the corner of your left eye nearest to your nose every 2 hours for the next 2 days, then decrease to every 4-6 hours for the next 5 days. 5 mL Eloise Hake Scales, PA-C   Olopatadine HCl (PATADAY) 0.2 % SOLN Apply 1 drop to eye daily. 2.5 mL Eloise Hake Scales, PA-C      PDMP not reviewed this  encounter.  Pending results:  Labs Reviewed - No data to display    Discharge Instructions      Please begin TobraDex eyedrops by placing 1 to 2 drops in the corner of your left eye, the rest to your nose, every 2 hours while you are awake for the next 2 days.  After that, you please instill 1 to 2 drops in the corner of your left eye every 4-6 hours for the next 5 days.  In addition to TobraDex, please instill 1 drop of Pataday into both eyes daily.  Pataday is a topical antihistamine which should help dry up the excessive tear production and reduce allergic reaction.  I have  also provided you with a prescription for Zyrtec that I would like for you to begin taking daily as well.  This will help reduce inflammation caused by allergies.  If you have not had meaningful improvement your symptoms or if your eye pain, drainage, swelling becomes worse despite using TobraDex, please seek emergency medical treatment.  You can either reach out to your eye doctor or go to the emergency room.  Here at urgent care we are not able to perform detailed eye exams.  Thank you for visiting [facility] today.    Disposition Upon Discharge:  Condition: stable for discharge home  Patient presented with an acute illness with associated systemic symptoms and significant discomfort requiring urgent management. In my opinion, this is a condition that a prudent lay person (someone who possesses an average knowledge of health and medicine) may potentially expect to result in complications if not addressed urgently such as respiratory distress, impairment of bodily function or dysfunction of bodily organs.   Routine symptom specific, illness specific and/or disease specific instructions were discussed with the patient and/or caregiver at length.   As such, the patient has been evaluated and assessed, work-up was performed and treatment was provided in alignment with urgent care protocols and evidence based medicine.  Patient/parent/caregiver has been advised that the patient may require follow up for further testing and treatment if the symptoms continue in spite of treatment, as clinically indicated and appropriate.  Patient/parent/caregiver has been advised to return to the [facility] or PCP if no better; to PCP or the Emergency Department if new signs and symptoms develop, or if the current signs or symptoms continue to change or worsen for further workup, evaluation and treatment as clinically indicated and appropriate  The patient will follow up with their current PCP if and as advised.  If the patient does not currently have a PCP we will assist them in obtaining one.   The patient may need specialty follow up if the symptoms continue, in spite of conservative treatment and management, for further workup, evaluation, consultation and treatment as clinically indicated and appropriate.  Patient/parent/caregiver verbalized understanding and agreement of plan as discussed.  All questions were addressed during visit.  Please see discharge instructions below for further details of plan.  This office note has been dictated using Teaching laboratory technician.  Unfortunately, this method of dictation can sometimes lead to typographical or grammatical errors.  I apologize for your inconvenience in advance if this occurs.  Please do not hesitate to reach out to me if clarification is needed.      Eloise Hake Scales, PA-C 03/11/24 1358

## 2024-05-10 ENCOUNTER — Ambulatory Visit
Admission: EM | Admit: 2024-05-10 | Discharge: 2024-05-10 | Disposition: A | Discharge status: Home / Self Care | Payer: Self-pay

## 2024-05-10 ENCOUNTER — Ambulatory Visit (INDEPENDENT_AMBULATORY_CARE_PROVIDER_SITE_OTHER): Payer: Self-pay

## 2024-05-10 DIAGNOSIS — S6991XA Unspecified injury of right wrist, hand and finger(s), initial encounter: Secondary | ICD-10-CM

## 2024-05-10 MED ORDER — IBUPROFEN 100 MG/5ML PO SUSP
800.0000 mg | Freq: Once | ORAL | Status: AC
  Administered 2024-05-10 (×2): 800 mg via ORAL

## 2024-05-10 NOTE — ED Provider Notes (Signed)
 Kaitlin Shannon    CSN: [id number] Arrival date & time: 05/10/24  1932    HISTORY   Chief Complaint  Patient presents with   Finger Injury   HPI Kaitlin Shannon is a pleasant, 43 y.o. female who presents to urgent care today. Pt states her right pinky got hit by a football yesterday while playing football with her son.  States that since then she has had swelling, pain, intermittent numbness, and tightness. States she has been applying ice to the finger without improvement.  Has not tried taking ibuprofen  because she cannot swallow pills.  The history is provided by the patient.   Past Medical History:  Diagnosis Date   Anemia    Astigmatism    Hypertension    no meds currently   Incompetent cervix    Unspecified vitamin D  deficiency    Uterine leiomyoma    Patient Active Problem List   Diagnosis Date Noted   Bulging lumbar disc 03/11/2024   DDD (degenerative disc disease), cervical 03/11/2024   Myofascial pain 03/11/2024   Pain of both sacroiliac joints 03/11/2024   Acute reaction to situational stress 12/06/2021   Low back pain 12/06/2021   Multiple renal cysts 12/06/2021   Neck pain 12/06/2021   Pain in right hand 10/12/2021   Tenosynovitis, de Quervain 10/02/2021   Hypertension 07/29/2021   Pelvic pain in female 12/30/2019   Anemia, iron deficiency 02/04/2013   Vitamin D  deficiency 01/27/2013   RECTAL BLEEDING 11/30/2008   FIBROIDS, UTERUS 10/18/2008   Asthma 10/18/2008   ANEMIA 07/08/2008   OBESITY 07/07/2008   ELEVATED BLOOD PRESSURE 07/07/2008   Past Surgical History:  Procedure Laterality Date   ABDOMINAL HYSTERECTOMY N/A 05/25/2013   Procedure: HYSTERECTOMY ABDOMINAL;  Surgeon: Harland JAYSON Birkenhead, MD;  Location: [facility];  Service: Gynecology;  Laterality: N/A;   BILATERAL SALPINGECTOMY Bilateral 05/25/2013   Procedure: BILATERAL SALPINGECTOMY;  Surgeon: Harland JAYSON Birkenhead, MD;  Location: [facility];  Service: Gynecology;  Laterality: Bilateral;   TUBAL LIGATION      vaginal deliveries  2005,2007, 2009   OB History     Gravida  5   Para  3   Term  1   Preterm  2   AB  2   Living  3      SAB  2   IAB      Ectopic      Multiple      Live Births             Home Medications    Prior to Admission medications   Medication Sig Start Date End Date Taking? Authorizing Provider  amoxicillin -clavulanate (AUGMENTIN) 875-125 MG tablet SMARTSIG:1 Tablet(s) By Mouth Every 12 Hours 03/08/24  Yes [provider]  clindamycin (CLEOCIN) 2 % vaginal cream Place 1 Applicatorful vaginally at bedtime. 02/26/24  Yes [provider]  estradiol (ESTRACE) 0.1 MG/GM vaginal cream Place 0.5 g vaginally. 06/06/20  Yes [provider]  fosfomycin (MONUROL) 3 g PACK Take 3 g by mouth daily. 02/20/24  Yes [provider]  metroNIDAZOLE (FLAGYL) 500 MG tablet SMARTSIG:1 Tablet(s) By Mouth Every 12 Hours 03/08/24  Yes [provider]  minocycline (DYNACIN) 100 MG tablet Take 100 mg by mouth 2 (two) times daily. 11/24/23  Yes [provider]  minocycline (MINOCIN) 100 MG capsule Take 100 mg by mouth every 12 (twelve) hours. 12/09/23  Yes [provider]  tiZANidine (ZANAFLEX) 2 MG tablet 1 tablet as needed Orally Three  times a day for 30 days 12/28/21  Yes [provider]  traZODone (DESYREL) 50 MG tablet TAKE 1/2 TO 2 TABLETS(25 TO 100 MG) BY MOUTH AT BEDTIME AS NEEDED FOR SLEEP OR ANXIETY 01/10/22  Yes [provider]  amLODipine (NORVASC) 10 MG tablet Take 1 tablet by mouth daily. 09/03/22   [provider]  cetirizine  (ZYRTEC  ALLERGY) 10 MG tablet Take 1 tablet (10 mg total) by mouth at bedtime. 03/11/24 09/07/24  Joesph Shaver Scales, PA-C  fluconazole (DIFLUCAN) 150 MG tablet Take 150 mg by mouth every 2 (two) hours.    [provider]  meloxicam  (MOBIC ) 15 MG tablet meloxicam  15 mg tablet  Take 1 tablet every day by oral route.    [provider]  Olopatadine   HCl (PATADAY ) 0.2 % SOLN Apply 1 drop to eye daily. 03/11/24   Joesph Shaver Scales, PA-C  tobramycin -dexamethasone  (TOBRADEX ) ophthalmic solution Instill 1 to 2 drops in the corner of your left eye nearest to your nose every 2 hours for the next 2 days, then decrease to every 4-6 hours for the next 5 days. 03/11/24   Joesph Shaver Scales, PA-C    Family History Family History  Problem Relation Age of Onset   COPD Mother    Seizures Mother    Heart disease Father        died of cardiac arrest   Other Father        bowel infection   Cancer Sister        lung   Cancer Paternal Grandfather        lung   Diabetes Neg Hx    Colon cancer Neg Hx    Cancer Sister        lung   Cancer Maternal Grandmother        breast cancer   Breast cancer Maternal Grandmother    Social History Social History   Tobacco Use   Smoking status: Never    Passive exposure: Never   Smokeless tobacco: Never  Vaping Use   Vaping status: Never Used  Substance Use Topics   Alcohol use: Yes    Comment: once or twice a year   Drug use: No   Allergies   Patient has no known allergies.  Review of Systems Review of Systems Pertinent findings revealed after performing a 14 point review of systems has been noted in the history of present illness.  Physical Exam Vital Signs BP (!) 155/105 (BP Location: Right Arm)   Pulse 67   Temp 97.7 F (36.5 C) (Oral)   Resp 18   LMP 04/27/2013   SpO2 99%   No data found.  Physical Exam Vitals and nursing note reviewed.  Constitutional:      General: She is awake.     Appearance: Normal appearance. She is well-developed and well-groomed.     Comments: Reports pain in right fifth finger 10 out of 10  Musculoskeletal:     Right hand: Swelling, deformity (Right fifth finger), tenderness and bony tenderness present. No lacerations. Decreased range of motion. Decreased strength of finger abduction and thumb/finger opposition. Normal strength of wrist extension.  Normal sensation. There is no disruption of two-point discrimination. Normal capillary refill. Normal pulse.  Neurological:     Mental Status: She is alert.  Psychiatric:        Behavior: Behavior is cooperative.     Visual Acuity Right Eye Distance:   Left Eye Distance:   Bilateral Distance:  Right Eye Near:   Left Eye Near:    Bilateral Near:     [facility] Couse / Diagnostics / Procedures:     Radiology No results found.  Procedures Procedures (including critical care time) EKG  Pending results:  Labs Reviewed - No data to display  Medications Ordered in Shannon: Medications  ibuprofen  (ADVIL ) 100 MG/5ML suspension 800 mg (800 mg Oral Given 05/10/24 2003)    [facility] Diagnoses / Final Clinical Impressions(s)   I have reviewed the triage vital signs and the nursing notes.  Pertinent labs & imaging results that were available during my care of the patient were reviewed by me and considered in my medical decision making (see chart for details).    Final diagnoses:  Finger injury, right, initial encounter   Official read of x-ray is still pending.  Per physical exam findings with respect to my personal interpretation of imaging, I believe the finger is dislocated but it is also possible that the distal aspect of proximal phalanx is broken.  Because the finger is deformed, were not able to splinted at this time without significant pain to patient.  She was provided with ibuprofen  for pain relief and advised to go to orthopedics in the morning for further evaluation and treatment.  Please see discharge instructions below for details of plan of care as provided to patient. ED Prescriptions   None    PDMP not reviewed this encounter.  Pending results:  Labs Reviewed - No data to display    Discharge Instructions      The radiologist's report of your x-ray of your right pinky will not be available for several more hours.  We will contact you in the morning with the findings of the  official report.  Based on the history that you provided today along with my personal interpretation of your x-ray, I believe that you have dislocated your right fifth finger.  You will need to follow-up with orthopedics for further evaluation and treatment.  Thank you for visiting [facility] today.        Disposition Upon Discharge:  Condition: stable for discharge home  Patient presented with an acute illness with associated systemic symptoms and significant discomfort requiring urgent management. In my opinion, this is a condition that a prudent lay person (someone who possesses an average knowledge of health and medicine) may potentially expect to result in complications if not addressed urgently such as respiratory distress, impairment of bodily function or dysfunction of bodily organs.   Routine symptom specific, illness specific and/or disease specific instructions were discussed with the patient and/or caregiver at length.   As such, the patient has been evaluated and assessed, work-up was performed and treatment was provided in alignment with urgent care protocols and evidence based medicine.  Patient/parent/caregiver has been advised that the patient may require follow up for further testing and treatment if the symptoms continue in spite of treatment, as clinically indicated and appropriate.  Patient/parent/caregiver has been advised to return to the [facility] or PCP if no better; to PCP or the Emergency Department if new signs and symptoms develop, or if the current signs or symptoms continue to change or worsen for further workup, evaluation and treatment as clinically indicated and appropriate  The patient will follow up with their current PCP if and as advised. If the patient does not currently have a PCP we will assist them in obtaining one.   The patient may need specialty follow up if the symptoms  continue, in spite of conservative treatment and management, for  further workup, evaluation, consultation and treatment as clinically indicated and appropriate.  Patient/parent/caregiver verbalized understanding and agreement of plan as discussed.  All questions were addressed during visit.  Please see discharge instructions below for further details of plan.  This office note has been dictated using Teaching laboratory technician.  Unfortunately, this method of dictation can sometimes lead to typographical or grammatical errors.  I apologize for your inconvenience in advance if this occurs.  Please do not hesitate to reach out to me if clarification is needed.      Joesph Shaver Scales, PA-C 05/10/24 2006

## 2024-05-10 NOTE — ED Triage Notes (Signed)
 Pt states her right pinky got hit by a football. Since then she has had swelling, pain, intermittent numbness, and tightness.   Start Date: 05/09/2024  Home Interventions: Ice

## 2024-05-10 NOTE — Discharge Instructions (Addendum)
 The radiologist's report of your x-ray of your right pinky will not be available for several more hours.  We will contact you in the morning with the findings of the official report.   Based on the history that you provided today along with my personal interpretation of your x-ray, I believe that you have dislocated your right fifth finger.  You will need to follow-up with orthopedics for further evaluation and treatment.   Thank you for visiting [facility] today.

## 2024-05-11 ENCOUNTER — Ambulatory Visit
Admission: EM | Admit: 2024-05-11 | Discharge: 2024-05-11 | Disposition: A | Discharge status: Home / Self Care | Payer: Self-pay | Attending: Emergency Medicine | Admitting: Emergency Medicine

## 2024-05-11 ENCOUNTER — Ambulatory Visit (HOSPITAL_COMMUNITY): Payer: Self-pay

## 2024-05-11 ENCOUNTER — Telehealth: Payer: Self-pay

## 2024-05-11 DIAGNOSIS — S62629A Displaced fracture of medial phalanx of unspecified finger, initial encounter for closed fracture: Secondary | ICD-10-CM

## 2024-05-11 MED ORDER — IBUPROFEN 800 MG PO TABS
800.0000 mg | ORAL_TABLET | Freq: Once | ORAL | Status: AC
  Administered 2024-05-11 (×2): 800 mg via ORAL

## 2024-05-11 NOTE — Discharge Instructions (Addendum)
 The radiologist's report of your x-ray of your right pinky revealed a volar plate avulsion fracture of the middle bone.  We have placed you in a finger splint for stability, buddy taped it to your ring finger for security and secured the splint to your hand for immobility.  You will need to wear this arrangement for at least the next 3 to 4 weeks, longer if you are still having pain or difficulty with movement.  You are welcome to remove the splint and replace the splint when you are bathing.  We also recommend that you keep your hand elevated over the level of your heart to prevent swelling which will decrease your pain.  You are welcome to continue taking ibuprofen  600 mg to 800 mg every 6-8 hours as needed for pain as well.  Thank you for visiting [facility] today.  We apologize for the inconvenience and hope you feel better soon.

## 2024-05-11 NOTE — Telephone Encounter (Deleted)
-----   Message from [facility] [place], [place] sent at 05/11/2024  9:47 AM EDT -----  I have attempted to reach patient this morning to inform her that her right little finger is broken, voicemail message left for patient to return call.  Patient was advised yesterday at discharge to  follow-up with orthopedics in the morning (today) because I suspected it was broken. ----- Message ----- From: Arnaldo Alfonso RAMAN, RN Sent: 05/11/2024   9:06 AM EDT To: Manuelita Presley Search, PA-C  Hello, please advise if any updates are needed for pt's X-ray. Thank you.   ----- Message ----- From: Interface, Rad Results In Sent: 05/10/2024   8:32 PM EDT To: [facility] Follow Up

## 2024-05-11 NOTE — ED Triage Notes (Signed)
 Pt presents per phone call with provider, stating that she should get a finger splint put on after the radiologist read that she had an avulsion fracture. Pt states she is still in pain and can not straighten her right pinky.

## 2024-05-11 NOTE — ED Provider Notes (Signed)
 Patient contacted by phone, advised that we have received the results of her x-ray showing volar plate avulsion fracture of the middle phalanx of her right fifth finger.  Patient states that she went to [facility] orthopedic urgent care earlier today.  States they took 2 x-rays, has photos of the images on her phone, advised her that right little finger was not broken, proximal phalanx of right fifth finger was buddy taped to a proximal phalanx of her right fourth finger.  Patient states she is still in consider amount of pain and swelling is not improved.  Patient was advised to come back to the clinic this morning and we would be happy to provide her with appropriate splinting based on the results of our x-ray which did reveal fracture.  Patient did so.    Upon inspection of the photos she has of her imaging, only 2 images were performed and neither 1 was lateral which was the view that the fracture was best seen in our images.  5.25 inch finger splint was placed on the volar aspect of her right fifth finger, secured with Coban.  Right fourth finger was buddy taped to the fifth finger.  Finger splint was secured to her palm using a 2 inch elastic bandage.    Patient was advised to keep her hand elevated at all times to help control pain.  Patient was advised to wear splint for the next 3 to 4 weeks to ensure best healing.  Patient was advised she can take ibuprofen  600 mg 800 mg every 6-8 hours as needed for pain.  Also advised to use ice if needed.  Conservative care recommended.  Return precautions advised.   Joesph Shaver Scales, PA-C 05/11/24 1139

## 2024-05-24 NOTE — Telephone Encounter (Signed)
 Pt was called and had verified their identity with multiple patient identifiers. Pt was informed of her x-ray results concerning her avulsion fracture.  Pt was instructed to come back here if needed due to having been seen at another urgent care and being told she had no fracture. Pt was informed she could call the other urgent care for best next steps. Pt verbalized understanding.

## 2024-05-25 ENCOUNTER — Emergency Department (HOSPITAL_BASED_OUTPATIENT_CLINIC_OR_DEPARTMENT_OTHER)
Admission: EM | Admit: 2024-05-25 | Discharge: 2024-05-25 | Disposition: A | Discharge status: Home / Self Care | Payer: Self-pay | Attending: Emergency Medicine | Admitting: Emergency Medicine

## 2024-05-25 ENCOUNTER — Encounter (HOSPITAL_BASED_OUTPATIENT_CLINIC_OR_DEPARTMENT_OTHER): Payer: Self-pay

## 2024-05-25 ENCOUNTER — Other Ambulatory Visit: Payer: Self-pay

## 2024-05-25 ENCOUNTER — Emergency Department (HOSPITAL_BASED_OUTPATIENT_CLINIC_OR_DEPARTMENT_OTHER): Payer: Self-pay

## 2024-05-25 DIAGNOSIS — R202 Paresthesia of skin: Secondary | ICD-10-CM | POA: Insufficient documentation

## 2024-05-25 DIAGNOSIS — R0602 Shortness of breath: Secondary | ICD-10-CM | POA: Insufficient documentation

## 2024-05-25 LAB — CBC
HCT: 38.9 % (ref 36.0–46.0)
Hemoglobin: 12.8 g/dL (ref 12.0–15.0)
MCH: 27.2 pg (ref 26.0–34.0)
MCHC: 32.9 g/dL (ref 30.0–36.0)
MCV: 82.8 fL (ref 80.0–100.0)
Platelets: 243 K/uL (ref 150–400)
RBC: 4.7 MIL/uL (ref 3.87–5.11)
RDW: 14.5 % (ref 11.5–15.5)
WBC: 5.7 K/uL (ref 4.0–10.5)
nRBC: 0 % (ref 0.0–0.2)

## 2024-05-25 LAB — MAGNESIUM: Magnesium: 2.3 mg/dL (ref 1.7–2.4)

## 2024-05-25 LAB — BASIC METABOLIC PANEL WITH GFR
Anion gap: 14 (ref 5–15)
BUN: 13 mg/dL (ref 6–20)
CO2: 23 mmol/L (ref 22–32)
Calcium: 9.9 mg/dL (ref 8.9–10.3)
Chloride: 99 mmol/L (ref 98–111)
Creatinine, Ser: 0.86 mg/dL (ref 0.44–1.00)
GFR, Estimated: >60 mL/min (ref >60)
Glucose, Bld: 89 mg/dL (ref 70–99)
Potassium: 4.2 mmol/L (ref 3.5–5.1)
Sodium: 137 mmol/L (ref 135–145)

## 2024-05-25 LAB — TROPONIN T, HIGH SENSITIVITY: Troponin T High Sensitivity: <15 ng/L (ref 0–19)

## 2024-05-25 LAB — D-DIMER, QUANTITATIVE: D-Dimer, Quant: 0.48 ug{FEU}/mL (ref 0.00–0.50)

## 2024-05-25 MED ORDER — IPRATROPIUM-ALBUTEROL 0.5-2.5 (3) MG/3ML IN SOLN
3.0000 mL | Freq: Once | RESPIRATORY_TRACT | Status: AC
  Administered 2024-05-25: 3 mL via RESPIRATORY_TRACT
  Filled 2024-05-25: qty 3

## 2024-05-25 NOTE — ED Notes (Signed)
 Pt refusing covid swab, states no symptoms, provider aware

## 2024-05-25 NOTE — ED Triage Notes (Addendum)
 Patient here POV from work.  Notes left first and second digits became numb while at work an hour ago (1030). Has since become better but numbness remains. Shortly afterwards became nauseated and SOB.   NAD Noted during triage. A&Ox4. GCS 15. Ambulatory.

## 2024-05-25 NOTE — ED Provider Notes (Signed)
 [facility] Provider Note   CSN: [id number] Arrival date & time: 05/25/24  1125     Patient presents with: Finger Numbness   Kaitlin Shannon is a 43 y.o. female presenting to ED with complaint of nausea, shortness of breath, left hand tingling.  Patient reports this began abruptly about an hour prior to arrival while she was at work.  She says her left hand 1st and 2nd digits felt like they went numb, although the feeling is returning to them.  Her forearm was fine.  She reports that she has felt short of breath or feel like she cannot take in a full breath since then.  She denies any headache to me.  Denies specific chest pain.  Reports that she felt very nauseated.  She says she woke up feeling normal this morning has not had any recent infection symptoms.  She denies any personal or family history of cardiac disease at a young age or early age.  Denies nicotine use to me.  She does report a family history of autoimmune disorder with lupus and autoimmune deficiency and her mother and aunt.  Denies family history of multiple sclerosis.  Patient denies any vision changes herself or prior history of paresthesias of the arms or legs.   HPI     Prior to Admission medications   Medication Sig Start Date End Date Taking? Authorizing Provider  amLODipine (NORVASC) 10 MG tablet Take 1 tablet by mouth daily. 09/03/22   [provider]    Allergies: Patient has no known allergies.    Review of Systems  Updated Vital Signs BP 114/76   Pulse 65   Temp 97.9 F (36.6 C) (Oral)   Resp 17   Ht 5' 7 (1.702 m)   Wt 97.5 kg   LMP 04/27/2013   SpO2 100%   BMI 33.67 kg/m   Physical Exam Constitutional:      General: She is not in acute distress. HENT:     Head: Normocephalic and atraumatic.  Eyes:     Conjunctiva/sclera: Conjunctivae normal.     Pupils: Pupils are equal, round, and reactive to light.  Cardiovascular:     Rate and  Rhythm: Normal rate and regular rhythm.  Pulmonary:     Effort: Pulmonary effort is normal. No respiratory distress.  Abdominal:     General: There is no distension.     Tenderness: There is no abdominal tenderness.  Musculoskeletal:     Right lower leg: Edema present.     Left lower leg: Edema present.  Skin:    General: Skin is warm and dry.  Neurological:     General: No focal deficit present.     Mental Status: She is alert and oriented to person, place, and time. Mental status is at baseline.     Comments: Patient reports paresthesia in the distal end of the left thumb and second finger.  Her grip strength and finger abduction strength testing is intact.  Gross and fine touch sensation intact.  No other neurological deficits noted on exam  Psychiatric:        Mood and Affect: Mood normal.        Behavior: Behavior normal.     (all labs ordered are listed, but only abnormal results are displayed) Labs Reviewed  BASIC METABOLIC PANEL WITH GFR  CBC  D-DIMER, QUANTITATIVE  MAGNESIUM  TROPONIN T, HIGH SENSITIVITY    EKG: EKG Interpretation Date/Time:  Tuesday May 25 2024 11:36:50 EDT Ventricular Rate:  68 PR Interval:  177 QRS Duration:  92 QT Interval:  406 QTC Calculation: 432 R Axis:   29  Text Interpretation: Sinus rhythm Probable left atrial enlargement Confirmed by Cottie Cough [phone]) on 05/25/2024 11:41:46 AM  Radiology: [facility] Chest 2 View Result Date: 05/25/2024 EXAM: 2 VIEW(S) XRAY OF THE CHEST 05/25/2024 01:03:00 PM COMPARISON: 11/21/11 CLINICAL HISTORY: Shortness of breath. Patient here POV from work. Notes left first and second digits became numb while at work an hour ago (1030). Has since become better but numbness remains. Shortly afterwards became nauseated and SOB. Elevated BP. NAD noted during triage. A\T\Ox4. GCS 15. Ambulatory. FINDINGS: LUNGS AND PLEURA: No focal pulmonary opacity. No pulmonary edema. No pleural effusion. No pneumothorax. HEART AND  MEDIASTINUM: No acute abnormality of the cardiac and mediastinal silhouettes. BONES AND SOFT TISSUES: No acute osseous abnormality. IMPRESSION: 1. No acute process. Electronically signed by: Waddell Calk MD 05/25/2024 01:41 PM EDT RP Workstation: [id number]     Procedures   Medications Ordered in the ED  ipratropium-albuterol  (DUONEB) 0.5-2.5 (3) MG/3ML nebulizer solution 3 mL (3 mLs Nebulization Given 05/25/24 1236)    Clinical Course as of 05/25/24 1501  Tue May 25, 2024  1351 BP 114/80 mmhg at this time.  Patient is minimally to asymptomatic.  She has follow-up already scheduled with her PCP tomorrow.  She is stable for discharge [MT]    Clinical Course User Index [MT] Keilani Terrance, Cough PARAS, MD                                 Medical Decision Making Amount and/or Complexity of Data Reviewed Labs: ordered. Radiology: ordered.  Risk Prescription drug management.   This patient presents to the ED with concern for shortness of breath, nausea, left hand paresthesia. This involves an extensive number of treatment options, and is a complaint that carries with it a high risk of complications and morbidity.  The differential diagnosis includes viral illness versus metabolic derangement versus atypical ACS versus autoimmune disorder versus other  Very low suspicion for stroke, subarachnoid hemorrhage, or intravascular and intracranial emergency.  No headache or neurological deficits or multiple stroke risk factors.  She does have a known history of high blood pressure.  Less likely acute onset of multiple sclerosis with no evidence of optic neuritis, no prior history of this or family history.   I ordered and personally interpreted labs.  The pertinent results include: No emergent findings  I ordered imaging studies including x-ray of the chest I independently visualized and interpreted imaging which showed no urgent findings I agree with the radiologist interpretation  The patient was  maintained on a cardiac monitor.  I personally viewed and interpreted the cardiac monitored which showed an underlying rhythm of: Sinus rhythm  Per my interpretation the patient's ECG shows sinus rhythm no acute ischemic findings  I ordered medication including albuterol  for potential shortness of breath or reactive airway disease  I have reviewed the patients home medicines and have made adjustments as needed  Test Considered: Low suspicion for acute PE with negative D-dimer.  No indication for CT angiogram imaging.  Doubt ACS.  Doubt stroke; doubt demyelinating lesion or multiple sclerosis.  This constellation of symptoms does not fit with a central nervous lesion and I do not think she needs stat neuroimaging at this time.  On reassessment the patient is feeling better; denies that she  has had any nausea since initial onset of her symptoms.  She has a PCP appointment tomorrow which I think would be good follow-up.  At this time I think she is reasonably stable for discharge  Disposition:  After consideration of the diagnostic results and the patients response to treatment, I feel that the patent would benefit from close outpatient follow-up.      Final diagnoses:  Paresthesias  Shortness of breath    ED Discharge Orders     None          Cottie Donnice PARAS, MD 05/25/24 [phone]

## 2024-06-18 ENCOUNTER — Ambulatory Visit: Payer: Self-pay
